# Patient Record
Sex: Male | Born: 1980
Health system: Southern US, Community
[De-identification: ages and names within clinical notes are randomized; demographics above are authoritative.]

## PROBLEM LIST (undated history)

## (undated) DIAGNOSIS — IMO0002 Reserved for concepts with insufficient information to code with codable children: Secondary | ICD-10-CM

## (undated) DIAGNOSIS — R0600 Dyspnea, unspecified: Secondary | ICD-10-CM

## (undated) DIAGNOSIS — K2970 Gastritis, unspecified, without bleeding: Secondary | ICD-10-CM

## (undated) DIAGNOSIS — K859 Acute pancreatitis without necrosis or infection, unspecified: Secondary | ICD-10-CM

## (undated) DIAGNOSIS — F329 Major depressive disorder, single episode, unspecified: Secondary | ICD-10-CM

## (undated) DIAGNOSIS — J45909 Unspecified asthma, uncomplicated: Secondary | ICD-10-CM

## (undated) DIAGNOSIS — F32A Depression, unspecified: Secondary | ICD-10-CM

## (undated) DIAGNOSIS — K219 Gastro-esophageal reflux disease without esophagitis: Secondary | ICD-10-CM

## (undated) DIAGNOSIS — F419 Anxiety disorder, unspecified: Secondary | ICD-10-CM

## (undated) DIAGNOSIS — A048 Other specified bacterial intestinal infections: Secondary | ICD-10-CM

## (undated) DIAGNOSIS — F1010 Alcohol abuse, uncomplicated: Secondary | ICD-10-CM

## (undated) DIAGNOSIS — F319 Bipolar disorder, unspecified: Secondary | ICD-10-CM

## (undated) DIAGNOSIS — M1990 Unspecified osteoarthritis, unspecified site: Secondary | ICD-10-CM

## (undated) HISTORY — DX: Depression, unspecified: F32.A

## (undated) HISTORY — DX: Gastritis, unspecified, without bleeding: K29.70

## (undated) HISTORY — DX: Anxiety disorder, unspecified: F41.9

## (undated) HISTORY — PX: FINGER SURGERY: SHX640

## (undated) HISTORY — DX: Major depressive disorder, single episode, unspecified: F32.9

## (undated) HISTORY — DX: Other specified bacterial intestinal infections: A04.8

## (undated) HISTORY — DX: Unspecified asthma, uncomplicated: J45.909

## (undated) HISTORY — DX: Alcohol abuse, uncomplicated: F10.10

## (undated) HISTORY — PX: OTHER SURGICAL HISTORY: SHX169

## (undated) HISTORY — DX: Unspecified osteoarthritis, unspecified site: M19.90

---

## 2011-05-08 ENCOUNTER — Emergency Department (HOSPITAL_COMMUNITY)
Admission: EM | Admit: 2011-05-08 | Discharge: 2011-05-08 | Disposition: A | Discharge status: Home / Self Care | Payer: Medicaid Other | Attending: Emergency Medicine | Admitting: Emergency Medicine

## 2011-05-08 DIAGNOSIS — R1110 Vomiting, unspecified: Secondary | ICD-10-CM | POA: Insufficient documentation

## 2011-05-08 DIAGNOSIS — K859 Acute pancreatitis without necrosis or infection, unspecified: Secondary | ICD-10-CM | POA: Insufficient documentation

## 2011-05-08 DIAGNOSIS — R109 Unspecified abdominal pain: Secondary | ICD-10-CM | POA: Insufficient documentation

## 2011-05-08 HISTORY — DX: Reserved for concepts with insufficient information to code with codable children: IMO0002

## 2011-05-08 LAB — CBC
HCT: 46.8 % (ref 39.0–52.0)
Hemoglobin: 16.7 g/dL (ref 13.0–17.0)
MCH: 31.3 pg (ref 26.0–34.0)
MCHC: 35.7 g/dL (ref 30.0–36.0)
MCV: 87.8 fL (ref 78.0–100.0)
Platelets: 291 10*3/uL (ref 150–400)
RBC: 5.33 MIL/uL (ref 4.22–5.81)
RDW: 12.9 % (ref 11.5–15.5)
WBC: 14.1 10*3/uL — ABNORMAL HIGH (ref 4.0–10.5)

## 2011-05-08 LAB — COMPREHENSIVE METABOLIC PANEL
ALT: 15 U/L (ref 0–53)
AST: 22 U/L (ref 0–37)
Albumin: 4.6 g/dL (ref 3.5–5.2)
Alkaline Phosphatase: 103 U/L (ref 39–117)
BUN: 16 mg/dL (ref 6–23)
CO2: 25 mEq/L (ref 19–32)
Calcium: 9.9 mg/dL (ref 8.4–10.5)
Chloride: 100 mEq/L (ref 96–112)
Creatinine, Ser: 0.72 mg/dL (ref 0.50–1.35)
GFR calc Af Amer: >90 mL/min (ref >90)
GFR calc non Af Amer: >90 mL/min (ref >90)
Glucose, Bld: 91 mg/dL (ref 70–99)
Potassium: 3.2 mEq/L — ABNORMAL LOW (ref 3.5–5.1)
Sodium: 140 mEq/L (ref 135–145)
Total Bilirubin: 1.2 mg/dL (ref 0.3–1.2)
Total Protein: 8.2 g/dL (ref 6.0–8.3)

## 2011-05-08 LAB — DIFFERENTIAL
Basophils Absolute: 0 10*3/uL (ref 0.0–0.1)
Basophils Relative: 0 % (ref 0–1)
Eosinophils Absolute: 0.1 10*3/uL (ref 0.0–0.7)
Eosinophils Relative: 0 % (ref 0–5)
Lymphocytes Relative: 23 % (ref 12–46)
Lymphs Abs: 3.2 10*3/uL (ref 0.7–4.0)
Monocytes Absolute: 1.2 10*3/uL — ABNORMAL HIGH (ref 0.1–1.0)
Monocytes Relative: 9 % (ref 3–12)
Neutro Abs: 9.6 10*3/uL — ABNORMAL HIGH (ref 1.7–7.7)
Neutrophils Relative %: 68 % (ref 43–77)

## 2011-05-08 LAB — LIPASE, BLOOD: Lipase: 181 U/L — ABNORMAL HIGH (ref 11–59)

## 2011-05-08 MED ORDER — ONDANSETRON HCL 4 MG/2ML IJ SOLN
4.0000 mg | Freq: Once | INTRAMUSCULAR | Status: AC
  Administered 2011-05-08: 4 mg via INTRAVENOUS

## 2011-05-08 MED ORDER — ONDANSETRON HCL 4 MG/2ML IJ SOLN
INTRAMUSCULAR | Status: AC
  Administered 2011-05-08: 4 mg via INTRAVENOUS
  Filled 2011-05-08: qty 2

## 2011-05-08 MED ORDER — OXYCODONE-ACETAMINOPHEN 5-325 MG PO TABS: 2.0000 | ORAL_TABLET | ORAL | Status: DC | PRN

## 2011-05-08 MED ORDER — SODIUM CHLORIDE 0.9 % IV BOLUS (SEPSIS)
1000.0000 mL | Freq: Once | INTRAVENOUS | Status: AC
  Administered 2011-05-08 (×2): 1000 mL via INTRAVENOUS

## 2011-05-08 MED ORDER — PROMETHAZINE HCL 25 MG PO TABS: 25.0000 mg | ORAL_TABLET | Freq: Four times a day (QID) | ORAL | Status: DC | PRN

## 2011-05-08 MED ORDER — ONDANSETRON HCL 4 MG/2ML IJ SOLN
INTRAMUSCULAR | Status: AC
  Filled 2011-05-08: qty 2

## 2011-05-08 MED ORDER — FENTANYL CITRATE 0.05 MG/ML IJ SOLN
50.0000 ug | Freq: Once | INTRAMUSCULAR | Status: AC
  Administered 2011-05-08: 50 ug via INTRAVENOUS
  Filled 2011-05-08: qty 2

## 2011-05-08 NOTE — ED Provider Notes (Signed)
History     CSN: [id number] Arrival date & time: 05/08/2011  9:45 AM   First MD Initiated Contact with Patient 05/08/11 1120      Chief Complaint  Patient presents with  . Emesis    (Consider location/radiation/quality/duration/timing/severity/associated sxs/prior treatment) Patient is a 30 y.o. male presenting with vomiting. The history is provided by the patient.  Emesis  This is a new problem. Associated symptoms include abdominal pain. Pertinent negatives include no diarrhea and no headaches.   patient has had upper abdominal pain and vomiting Friday. He states his been unable to keep anything down. He states that when he eats the pain gets worse. He states she's taken Zofran and Phenergan without relief. He drinks alcohol. He has not vomited blood. No diarrhea. No fevers. He has not had pain like this previously. He has not had sick contacts. He does have a history of ulcers.  Past Medical History  Diagnosis Date  . Ulcer     Past Surgical History  Procedure Date  . Hand surgery     History reviewed. No pertinent family history.  History  Substance Use Topics  . Smoking status: Current Everyday Smoker  . Smokeless tobacco: Not on file  . Alcohol Use: Yes      Review of Systems  Constitutional: Negative for activity change and appetite change.  HENT: Negative for neck stiffness.   Eyes: Negative for pain.  Respiratory: Negative for chest tightness and shortness of breath.   Cardiovascular: Negative for chest pain and leg swelling.  Gastrointestinal: Positive for nausea, vomiting and abdominal pain. Negative for diarrhea, blood in stool and anal bleeding.  Genitourinary: Negative for flank pain.  Musculoskeletal: Negative for back pain.  Skin: Negative for rash.  Neurological: Negative for weakness, numbness and headaches.  Psychiatric/Behavioral: Negative for behavioral problems.    Allergies  Review of patient's allergies indicates no known  allergies.  Home Medications   Current Outpatient Rx  Name Route Sig Dispense Refill  . LANSOPRAZOLE 30 MG PO CPDR Oral Take 30 mg by mouth daily.      . OXYCODONE-ACETAMINOPHEN 5-325 MG PO TABS Oral Take 2 tablets by mouth every 4 (four) hours as needed for pain. 20 tablet 0  . PROMETHAZINE HCL 25 MG PO TABS Oral Take 1 tablet (25 mg total) by mouth every 6 (six) hours as needed for nausea. 30 tablet 0    BP 136/90  Pulse 72  Temp(Src) 98.4 F (36.9 C) (Oral)  Resp 19  SpO2 97%  Physical Exam  Nursing note and vitals reviewed. Constitutional: He is oriented to person, place, and time. He appears well-developed and well-nourished.  HENT:  Head: Normocephalic and atraumatic.  Eyes: EOM are normal. Pupils are equal, round, and reactive to light.  Neck: Normal range of motion. Neck supple.  Cardiovascular: Normal rate, regular rhythm and normal heart sounds.   No murmur heard. Pulmonary/Chest: Effort normal and breath sounds normal.  Abdominal: Soft. Bowel sounds are normal. He exhibits no distension and no mass. There is tenderness. There is no rebound and no guarding.       Tender left upper abdomen and epigastric area. No right upper quadrant tenderness. No rebound or guarding.  Musculoskeletal: Normal range of motion. He exhibits no edema.  Neurological: He is alert and oriented to person, place, and time. No cranial nerve deficit.  Skin: Skin is warm and dry.  Psychiatric: He has a normal mood and affect.    ED Course  Procedures (including  critical care time)  Labs Reviewed  CBC - Abnormal; Notable for the following:    WBC 14.1 (*)    All other components within normal limits  DIFFERENTIAL - Abnormal; Notable for the following:    Neutro Abs 9.6 (*)    Monocytes Absolute 1.2 (*)    All other components within normal limits  COMPREHENSIVE METABOLIC PANEL - Abnormal; Notable for the following:    Potassium 3.2 (*)    All other components within normal limits   LIPASE, BLOOD - Abnormal; Notable for the following:    Lipase 181 (*)    All other components within normal limits   No results found.   1. Pancreatitis       MDM  Patient has had abdominal pain and NV for the last 5 days. Labs reassuring except for pancreatitis. Tolerating orals here. Requesting discharge home. Will d/c with pain meds and antiemetics.         Juliet Rude. Rubin Payor, MD 05/08/11 [phone]

## 2011-05-08 NOTE — ED Notes (Signed)
Lab results have returned.  Waiting for MD re-evaluation.

## 2011-05-08 NOTE — ED Notes (Signed)
Pt and family updated that we are waiting for lab results.  THe laboratory has be notified about delay in results.

## 2011-05-08 NOTE — ED Notes (Signed)
Pt c/o N/V since Friday; states that he is unable to keep anything down. Pt states that he is taking phenergan.

## 2011-05-08 NOTE — ED Notes (Signed)
Family at bedside. 

## 2011-05-08 NOTE — ED Notes (Signed)
The pt is cramping since he drank the coke

## 2011-05-08 NOTE — ED Notes (Signed)
Patient is resting comfortably. 

## 2011-05-08 NOTE — ED Notes (Signed)
PT states that he has been unable to keep any po's down since Friday.  Pt states that he has only had 1.5 cans of chicken noodle soup.  Pt has no vomiting at this time.

## 2011-05-10 ENCOUNTER — Emergency Department (HOSPITAL_COMMUNITY)
Admission: EM | Admit: 2011-05-10 | Discharge: 2011-05-11 | Disposition: A | Discharge status: Home / Self Care | Payer: Medicaid Other | Attending: Emergency Medicine | Admitting: Emergency Medicine

## 2011-05-10 ENCOUNTER — Emergency Department (HOSPITAL_COMMUNITY): Payer: Medicaid Other

## 2011-05-10 ENCOUNTER — Encounter (HOSPITAL_COMMUNITY): Payer: Self-pay | Admitting: *Deleted

## 2011-05-10 DIAGNOSIS — R10819 Abdominal tenderness, unspecified site: Secondary | ICD-10-CM | POA: Insufficient documentation

## 2011-05-10 DIAGNOSIS — F17200 Nicotine dependence, unspecified, uncomplicated: Secondary | ICD-10-CM | POA: Insufficient documentation

## 2011-05-10 DIAGNOSIS — Z8711 Personal history of peptic ulcer disease: Secondary | ICD-10-CM | POA: Insufficient documentation

## 2011-05-10 DIAGNOSIS — R109 Unspecified abdominal pain: Secondary | ICD-10-CM | POA: Insufficient documentation

## 2011-05-10 HISTORY — DX: Acute pancreatitis without necrosis or infection, unspecified: K85.90

## 2011-05-10 LAB — CBC
HCT: 37.3 % — ABNORMAL LOW (ref 39.0–52.0)
Hemoglobin: 13.3 g/dL (ref 13.0–17.0)
MCH: 30.8 pg (ref 26.0–34.0)
MCHC: 35.7 g/dL (ref 30.0–36.0)
MCV: 86.3 fL (ref 78.0–100.0)
Platelets: 228 10*3/uL (ref 150–400)
RBC: 4.32 MIL/uL (ref 4.22–5.81)
RDW: 12.5 % (ref 11.5–15.5)
WBC: 12.8 10*3/uL — ABNORMAL HIGH (ref 4.0–10.5)

## 2011-05-10 LAB — URINALYSIS, ROUTINE W REFLEX MICROSCOPIC
Bilirubin Urine: NEGATIVE
Glucose, UA: NEGATIVE mg/dL
Hgb urine dipstick: NEGATIVE
Ketones, ur: >80 mg/dL — AB
Leukocytes, UA: NEGATIVE
Nitrite: NEGATIVE
Protein, ur: NEGATIVE mg/dL
Specific Gravity, Urine: 1.025 (ref 1.005–1.030)
Urobilinogen, UA: 1 mg/dL (ref 0.0–1.0)
pH: 8.5 — ABNORMAL HIGH (ref 5.0–8.0)

## 2011-05-10 LAB — BASIC METABOLIC PANEL
BUN: 12 mg/dL (ref 6–23)
CO2: 21 mEq/L (ref 19–32)
Calcium: 8.8 mg/dL (ref 8.4–10.5)
Chloride: 104 mEq/L (ref 96–112)
Creatinine, Ser: 0.58 mg/dL (ref 0.50–1.35)
GFR calc Af Amer: >90 mL/min (ref >90)
GFR calc non Af Amer: >90 mL/min (ref >90)
Glucose, Bld: 107 mg/dL — ABNORMAL HIGH (ref 70–99)
Potassium: 3.3 mEq/L — ABNORMAL LOW (ref 3.5–5.1)
Sodium: 138 mEq/L (ref 135–145)

## 2011-05-10 LAB — URINE MICROSCOPIC-ADD ON

## 2011-05-10 LAB — LIPASE, BLOOD: Lipase: 17 U/L (ref 11–59)

## 2011-05-10 MED ORDER — HYDROMORPHONE HCL PF 1 MG/ML IJ SOLN
1.0000 mg | Freq: Once | INTRAMUSCULAR | Status: AC
  Administered 2011-05-10: 1 mg via INTRAVENOUS
  Filled 2011-05-10: qty 1

## 2011-05-10 MED ORDER — IOHEXOL 300 MG/ML  SOLN
100.0000 mL | Freq: Once | INTRAMUSCULAR | Status: AC | PRN
  Administered 2011-05-10: 100 mL via INTRAVENOUS

## 2011-05-10 MED ORDER — ONDANSETRON HCL 4 MG/2ML IJ SOLN
4.0000 mg | Freq: Once | INTRAMUSCULAR | Status: AC
  Administered 2011-05-10: 20:00:00 via INTRAVENOUS
  Filled 2011-05-10: qty 2

## 2011-05-10 MED ORDER — ONDANSETRON HCL 4 MG/2ML IJ SOLN
INTRAMUSCULAR | Status: AC
  Filled 2011-05-10: qty 2

## 2011-05-10 NOTE — ED Provider Notes (Signed)
History     CSN: [id number] Arrival date & time: 05/10/2011  7:24 PM   First MD Initiated Contact with Patient 05/10/11 2106      Chief Complaint  Patient presents with  . Abdominal Pain    (Consider location/radiation/quality/duration/timing/severity/associated sxs/prior treatment) HPI The patient presents today as after a recent presentation for abdominal pain to [facility]. He notes that over the interval 2 days he's had increasing pain, focally about the epigastrium. The pain is sharp, crampy, nonradiating. The pain was minimally relieved with oral narcotics, but now there is no alleviating factors. The pain is worsening with no clear provoking factor. He was diagnosed 2 days ago with pancreatitis. Since discharge he has become in tolerance of food, and oral medications, including Percocet and Zofran. He now notes that with his increasing pain and nausea, he is feeling more fatigued as well. No fever, no chills, no diarrhea, no urinary complaints. The patient previously. Although he has not had anything to drink in several days. Past Medical History  Diagnosis Date  . Ulcer   . Pancreatitis     Past Surgical History  Procedure Date  . Hand surgery     No family history on file.  History  Substance Use Topics  . Smoking status: Current Everyday Smoker  . Smokeless tobacco: Not on file  . Alcohol Use: Yes      Review of Systems Gen: Per HPI HEENT: No HA CV: No CP Resp: No dyspnea Abd: Per HPI, otherwise negative Musk: Per HPI, otherwise negative Neuro: No dysesthesia, or focal changes GU: Per HPI, otherwise negative Skin: Neg Psych: Neg  Allergies  Review of patient's allergies indicates no known allergies.  Home Medications   Current Outpatient Rx  Name Route Sig Dispense Refill  . ALPRAZOLAM 1 MG PO TABS Oral Take 1 mg by mouth daily.      Marland Kitchen LANSOPRAZOLE 30 MG PO CPDR Oral Take 30 mg by mouth daily.      . OXYCODONE-ACETAMINOPHEN 5-325 MG PO  TABS Oral Take 2 tablets by mouth every 4 (four) hours as needed for pain. 20 tablet 0  . PROMETHAZINE HCL 25 MG PO TABS Oral Take 1 tablet (25 mg total) by mouth every 6 (six) hours as needed for nausea. 30 tablet 0    BP 185/82  Pulse 71  Temp(Src) 98.5 F (36.9 C) (Oral)  Resp 16  SpO2 100%  Physical Exam  Constitutional: He is oriented to person, place, and time.       Young male, visibly uncomfortable.  HENT:  Head: Normocephalic and atraumatic.  Eyes: Conjunctivae are normal. Pupils are equal, round, and reactive to light.  Neck: Neck supple.  Cardiovascular: Normal rate and regular rhythm.   Pulmonary/Chest: Effort normal and breath sounds normal.  Abdominal: There is tenderness. There is guarding.  Musculoskeletal: He exhibits no tenderness.  Neurological: He is alert and oriented to person, place, and time.  Skin: Skin is warm and dry.  Psychiatric: He has a normal mood and affect.    ED Course  Procedures (including critical care time)  Labs Reviewed  URINALYSIS, ROUTINE W REFLEX MICROSCOPIC - Abnormal; Notable for the following:    Appearance TURBID (*)    pH 8.5 (*)    Ketones, ur >80 (*)    All other components within normal limits  BASIC METABOLIC PANEL - Abnormal; Notable for the following:    Potassium 3.3 (*)    Glucose, Bld 107 (*)  All other components within normal limits  CBC - Abnormal; Notable for the following:    WBC 12.8 (*)    HCT 37.3 (*)    All other components within normal limits  URINE MICROSCOPIC-ADD ON - Abnormal; Notable for the following:    Bacteria, UA MANY (*)    All other components within normal limits  LIPASE, BLOOD   No results found.   No diagnosis found.  Young male with recent diagnosis of pancreatitis now presenting with worsening pain, by mouth intolerance. Concern for pancreatic pseudocyst. The patient will have CT, as he receives analgesia, and IV fluids.  MDM   This 30 year old male with recent diagnosis of  pancreatitis now presents with abdominal pain. On exam the patient is uncomfortable. The patient's labs are notable for a lipase of only 17, notably down. His urinalysis is suggestive of dehydration. The patient's CAT scan does not demonstrate acute findings. Given the patient's history of pancreatitis, recent alcohol use, stopping several days ago, as well as his presentation for abdominal pain and lack of by mouth tolerance, resolving pancreatitis is the most likely cause of this evenings presentation. Other considerations include acute food reaction, given the patient attempted to eat a cheeseburger today. Other acute GI etiology as are affectively ruled out via CT scan. On reexam the patient felt much better, was talking clearly, in no distress. He will receive an initial liter of normal saline, additional analgesia, and will be discharged with antiemetics.       Gerhard Munch, MD 05/11/11 [phone]

## 2011-05-10 NOTE — ED Notes (Signed)
[id number]<AV> Expected date:05/10/11<BR> Expected time: 7:13 PM<BR> Means of arrival:Ambulance<BR> Comments:<BR> EMS 31 GC, pancreatitis

## 2011-05-10 NOTE — ED Notes (Signed)
Pt was diagnosed with pancreatitis on Monday at [facility]. Pt had flare up around 4:30 today, n/v and pain 10/10. Given zofran enroute. Unable to tolerate percocet and phenergan d/t vomiting.

## 2011-05-11 MED ORDER — ONDANSETRON 4 MG PO TBDP: 8.0000 mg | ORAL_TABLET | Freq: Three times a day (TID) | ORAL | Status: AC | PRN

## 2011-05-11 MED ORDER — OXYCODONE-ACETAMINOPHEN 5-325 MG PO TABS: 1.0000 | ORAL_TABLET | ORAL | Status: AC | PRN

## 2011-05-11 MED ORDER — HYDROMORPHONE HCL PF 1 MG/ML IJ SOLN
1.0000 mg | Freq: Once | INTRAMUSCULAR | Status: AC
  Administered 2011-05-11: 1 mg via INTRAVENOUS
  Filled 2011-05-11: qty 1

## 2011-05-11 MED ORDER — SODIUM CHLORIDE 0.9 % IV BOLUS (SEPSIS)
1000.0000 mL | Freq: Once | INTRAVENOUS | Status: AC
  Administered 2011-05-11: 1000 mL via INTRAVENOUS

## 2011-05-11 MED ORDER — ONDANSETRON HCL 4 MG/2ML IJ SOLN
4.0000 mg | Freq: Once | INTRAMUSCULAR | Status: AC
  Administered 2011-05-11: 4 mg via INTRAVENOUS
  Filled 2011-05-11: qty 2

## 2011-12-30 ENCOUNTER — Emergency Department (HOSPITAL_COMMUNITY)
Admission: EM | Admit: 2011-12-30 | Discharge: 2011-12-30 | Disposition: A | Discharge status: Home / Self Care | Payer: BC Managed Care – PPO | Attending: Emergency Medicine | Admitting: Emergency Medicine

## 2011-12-30 ENCOUNTER — Encounter (HOSPITAL_COMMUNITY): Payer: Self-pay | Admitting: *Deleted

## 2011-12-30 ENCOUNTER — Emergency Department (HOSPITAL_COMMUNITY): Payer: BC Managed Care – PPO

## 2011-12-30 DIAGNOSIS — R109 Unspecified abdominal pain: Secondary | ICD-10-CM | POA: Insufficient documentation

## 2011-12-30 DIAGNOSIS — K2970 Gastritis, unspecified, without bleeding: Secondary | ICD-10-CM | POA: Insufficient documentation

## 2011-12-30 DIAGNOSIS — R112 Nausea with vomiting, unspecified: Secondary | ICD-10-CM | POA: Insufficient documentation

## 2011-12-30 DIAGNOSIS — R630 Anorexia: Secondary | ICD-10-CM | POA: Insufficient documentation

## 2011-12-30 DIAGNOSIS — K2990 Gastroduodenitis, unspecified, without bleeding: Secondary | ICD-10-CM | POA: Insufficient documentation

## 2011-12-30 DIAGNOSIS — R6883 Chills (without fever): Secondary | ICD-10-CM | POA: Insufficient documentation

## 2011-12-30 LAB — COMPREHENSIVE METABOLIC PANEL
ALT: 33 U/L (ref 0–53)
AST: 42 U/L — ABNORMAL HIGH (ref 0–37)
Albumin: 4.4 g/dL (ref 3.5–5.2)
Alkaline Phosphatase: 80 U/L (ref 39–117)
BUN: 15 mg/dL (ref 6–23)
CO2: 25 mEq/L (ref 19–32)
Calcium: 9.3 mg/dL (ref 8.4–10.5)
Chloride: 104 mEq/L (ref 96–112)
Creatinine, Ser: 0.63 mg/dL (ref 0.50–1.35)
GFR calc Af Amer: >90 mL/min (ref >90)
GFR calc non Af Amer: >90 mL/min (ref >90)
Glucose, Bld: 126 mg/dL — ABNORMAL HIGH (ref 70–99)
Potassium: 3.7 mEq/L (ref 3.5–5.1)
Sodium: 140 mEq/L (ref 135–145)
Total Bilirubin: 0.8 mg/dL (ref 0.3–1.2)
Total Protein: 7.5 g/dL (ref 6.0–8.3)

## 2011-12-30 LAB — URINALYSIS, ROUTINE W REFLEX MICROSCOPIC
Bilirubin Urine: NEGATIVE
Glucose, UA: NEGATIVE mg/dL
Hgb urine dipstick: NEGATIVE
Ketones, ur: NEGATIVE mg/dL
Leukocytes, UA: NEGATIVE
Nitrite: NEGATIVE
Protein, ur: NEGATIVE mg/dL
Specific Gravity, Urine: 1.022 (ref 1.005–1.030)
Urobilinogen, UA: 0.2 mg/dL (ref 0.0–1.0)
pH: 6.5 (ref 5.0–8.0)

## 2011-12-30 LAB — CBC
HCT: 41.2 % (ref 39.0–52.0)
Hemoglobin: 14.7 g/dL (ref 13.0–17.0)
MCH: 31.5 pg (ref 26.0–34.0)
MCHC: 35.7 g/dL (ref 30.0–36.0)
MCV: 88.2 fL (ref 78.0–100.0)
Platelets: 244 10*3/uL (ref 150–400)
RBC: 4.67 MIL/uL (ref 4.22–5.81)
RDW: 13.3 % (ref 11.5–15.5)
WBC: 15.4 10*3/uL — ABNORMAL HIGH (ref 4.0–10.5)

## 2011-12-30 LAB — LIPASE, BLOOD: Lipase: 19 U/L (ref 11–59)

## 2011-12-30 LAB — LACTIC ACID, PLASMA: Lactic Acid, Venous: 1 mmol/L (ref 0.5–2.2)

## 2011-12-30 MED ORDER — FAMOTIDINE IN NACL 20-0.9 MG/50ML-% IV SOLN
20.0000 mg | Freq: Once | INTRAVENOUS | Status: AC
  Administered 2011-12-30: 20 mg via INTRAVENOUS
  Filled 2011-12-30: qty 50

## 2011-12-30 MED ORDER — HYDROMORPHONE HCL PF 1 MG/ML IJ SOLN
1.0000 mg | Freq: Once | INTRAMUSCULAR | Status: AC
  Administered 2011-12-30: 1 mg via INTRAVENOUS
  Filled 2011-12-30 (×2): qty 1

## 2011-12-30 MED ORDER — ONDANSETRON HCL 4 MG/2ML IJ SOLN
4.0000 mg | Freq: Once | INTRAMUSCULAR | Status: AC
  Administered 2011-12-30: 4 mg via INTRAVENOUS

## 2011-12-30 MED ORDER — ONDANSETRON HCL 4 MG/2ML IJ SOLN
4.0000 mg | Freq: Once | INTRAMUSCULAR | Status: AC
  Administered 2011-12-30: 4 mg via INTRAVENOUS
  Filled 2011-12-30 (×2): qty 2

## 2011-12-30 MED ORDER — OXYCODONE-ACETAMINOPHEN 5-325 MG PO TABS: 1.0000 | ORAL_TABLET | Freq: Four times a day (QID) | ORAL | Status: DC | PRN

## 2011-12-30 MED ORDER — SUCRALFATE 1 GM/10ML PO SUSP: 1.0000 g | Freq: Four times a day (QID) | ORAL | Status: DC

## 2011-12-30 MED ORDER — RANITIDINE HCL 300 MG PO TABS: 300.0000 mg | ORAL_TABLET | Freq: Two times a day (BID) | ORAL | Status: DC

## 2011-12-30 MED ORDER — ONDANSETRON HCL 4 MG/2ML IJ SOLN
INTRAMUSCULAR | Status: AC
  Filled 2011-12-30: qty 2

## 2011-12-30 MED ORDER — HYDROMORPHONE HCL PF 1 MG/ML IJ SOLN
1.0000 mg | Freq: Once | INTRAMUSCULAR | Status: AC
  Administered 2011-12-30: 1 mg via INTRAVENOUS
  Filled 2011-12-30: qty 1

## 2011-12-30 NOTE — ED Notes (Signed)
Reports having abd pain and n/v. Went to [facility] on thurs for same, has hx of pancreatitis. Denies diarrhea.

## 2011-12-30 NOTE — ED Provider Notes (Signed)
History     CSN: [id number]  Arrival date & time 12/30/11  1242   First MD Initiated Contact with Patient 12/30/11 1504      Chief Complaint  Patient presents with  . Abdominal Pain  . Emesis    (Consider location/radiation/quality/duration/timing/severity/associated sxs/prior treatment) Patient is a 31 y.o. male presenting with abdominal pain and vomiting. The history is provided by the patient.  Abdominal Pain The primary symptoms of the illness include abdominal pain, nausea and vomiting. The primary symptoms of the illness do not include diarrhea or dysuria. The current episode started 2 days ago. The onset of the illness was gradual. The problem has been gradually worsening.  The abdominal pain is located in the epigastric region. The abdominal pain radiates to the LUQ, RLQ and periumbilical region. The severity of the abdominal pain is 8/10. The abdominal pain is relieved by nothing. The abdominal pain is exacerbated by eating and vomiting.  The vomiting began 2 days ago. The emesis contains stomach contents.  Associated with: no recent NSAID or alcohol use. The patient has not had a change in bowel habit. Additional symptoms associated with the illness include chills and anorexia. Symptoms associated with the illness do not include diaphoresis, urgency, frequency or back pain. Significant associated medical issues do not include gallstones or liver disease. Associated medical issues comments: hx of pancreatitis.  Emesis  Associated symptoms include abdominal pain and chills. Pertinent negatives include no diarrhea.    Past Medical History  Diagnosis Date  . Ulcer   . Pancreatitis     Past Surgical History  Procedure Date  . Hand surgery     History reviewed. No pertinent family history.  History  Substance Use Topics  . Smoking status: Current Everyday Smoker  . Smokeless tobacco: Not on file  . Alcohol Use: Yes      Review of Systems  Constitutional: Positive  for chills. Negative for diaphoresis.  Gastrointestinal: Positive for nausea, vomiting, abdominal pain and anorexia. Negative for diarrhea.  Genitourinary: Negative for dysuria, urgency and frequency.  Musculoskeletal: Negative for back pain.  All other systems reviewed and are negative.    Allergies  Review of patient's allergies indicates no known allergies.  Home Medications   Current Outpatient Rx  Name Route Sig Dispense Refill  . ALPRAZOLAM 1 MG PO TABS Oral Take 1 mg by mouth daily.      Marland Kitchen LANSOPRAZOLE 30 MG PO CPDR Oral Take 30 mg by mouth daily.        BP 145/95  Pulse 50  Temp 98 F (36.7 C) (Oral)  Resp 18  SpO2 99%  Physical Exam  Nursing note and vitals reviewed. Constitutional: He is oriented to person, place, and time. He appears well-developed and well-nourished. He appears distressed.  HENT:  Head: Normocephalic and atraumatic.  Mouth/Throat: Oropharynx is clear and moist.  Eyes: Conjunctivae and EOM are normal. Pupils are equal, round, and reactive to light.  Neck: Normal range of motion. Neck supple.  Cardiovascular: Normal rate, regular rhythm and intact distal pulses.   No murmur heard. Pulmonary/Chest: Effort normal and breath sounds normal. No respiratory distress. He has no wheezes. He has no rales.  Abdominal: Soft. Normal appearance and bowel sounds are normal. He exhibits no distension. There is tenderness in the epigastric area and periumbilical area. There is guarding. There is no rebound and no CVA tenderness.  Musculoskeletal: Normal range of motion. He exhibits no edema and no tenderness.  Neurological: He is alert and  oriented to person, place, and time.  Skin: Skin is warm and dry. No rash noted. No erythema.  Psychiatric: He has a normal mood and affect. His behavior is normal.    ED Course  Procedures (including critical care time)  Labs Reviewed  CBC - Abnormal; Notable for the following:    WBC 15.4 (*)     All other components  within normal limits  COMPREHENSIVE METABOLIC PANEL - Abnormal; Notable for the following:    Glucose, Bld 126 (*)     AST 42 (*)  HEMOLYSIS AT THIS LEVEL MAY AFFECT RESULT   All other components within normal limits  URINALYSIS, ROUTINE W REFLEX MICROSCOPIC  LIPASE, BLOOD  LACTIC ACID, PLASMA   US Abdomen Complete  12/30/2011  *RADIOLOGY REPORT*  Clinical Data:  Abdominal pain and emesis  ABDOMINAL ULTRASOUND COMPLETE  Comparison:  None.  Findings:  Gallbladder:  No gallstones, gallbladder wall thickening, or pericholecystic fluid.  Common Bile Duct:  Within normal limits in caliber.  Liver: No focal mass lesion identified.  Within normal limits in parenchymal echogenicity.  IVC:  Appears normal.  Pancreas:  No abnormality identified.  Spleen:  Within normal limits in size and echotexture.  Right kidney:  Normal in size and parenchymal echogenicity.  No evidence of mass or hydronephrosis.  Left kidney:  Normal in size and parenchymal echogenicity.  No evidence of mass or hydronephrosis.  Abdominal Aorta:  No aneurysm identified.  IMPRESSION: Within normal limits.  Original Report Authenticated By: Donavan Burnet, M.D.     No diagnosis found.    MDM   Patient with upper abdominal pain that started 3 days ago. He was initially seen his PCP office and had a negative CT scan on Friday. Since that time he's continued to vomit with abdominal pain but no diarrhea. Patient states that he's taking Zofran, Phenergan, Prevacid without improvement. He has a history of pancreatitis but states he's not drinking alcohol for several weeks prior to the pain starting.  CBC with leukocytosis of 15,000 but normal hemoglobin. CMP with an elevated AST but otherwise normal.  Lipase, UA and lactate pending. Patient started on IV fluids, pain, nausea and antacid medication.  6:22 PM Labs are within normal limits. Ultrasound is unremarkable. On reevaluation patient stomach pain is improving. Patient was given one  more dose of pain medication and by mouth challenge. Discussed with him starting Zantac in addition to the Prilosec. Also given referral for GI.      Gwyneth Sprout, MD 12/30/11 [phone]

## 2011-12-30 NOTE — ED Notes (Signed)
Patient is alert and oriented x4.  Patient's pain level is a 2.  Patient was explained discharge instructions and verbalized understanding. Patient's wife was present as well and did not have questions.  Patient was taken home by wife.  Vitals are recorded, IV discontinued.  No further actions taken.

## 2011-12-31 ENCOUNTER — Encounter: Payer: Self-pay | Admitting: Gastroenterology

## 2012-01-09 ENCOUNTER — Encounter: Payer: Self-pay | Admitting: Gastroenterology

## 2012-01-09 ENCOUNTER — Ambulatory Visit (INDEPENDENT_AMBULATORY_CARE_PROVIDER_SITE_OTHER): Payer: BC Managed Care – PPO | Admitting: Gastroenterology

## 2012-01-09 VITALS — BP 100/58 | HR 88 | Ht 68.0 in | Wt 163.1 lb

## 2012-01-09 DIAGNOSIS — R1013 Epigastric pain: Secondary | ICD-10-CM

## 2012-01-09 MED ORDER — ONDANSETRON HCL 4 MG PO TABS: 4.0000 mg | ORAL_TABLET | Freq: Once | ORAL | Status: DC

## 2012-01-09 MED ORDER — OXYCODONE-ACETAMINOPHEN 5-325 MG PO TABS: 1.0000 | ORAL_TABLET | Freq: Four times a day (QID) | ORAL | Status: AC | PRN

## 2012-01-09 NOTE — Assessment & Plan Note (Addendum)
Intermittent episodes of upper bowel pain with nausea and vomiting may be related to acute intermittent pancreatitis. It is noteworthy that lipase was elevated on only one occasion. Despite this I have some concern that pain is pancreatic in origin. Ulcer and nonulcer dyspepsia, possible intermittent gastric outlet obstruction are other concerns.  Recommendations #1 upper endoscopy #2 the patient was carefully counseled to not take NSAIDs or alcohol

## 2012-01-09 NOTE — Progress Notes (Signed)
History of Present Illness:  31 year old white male referred at the request of Dr. Daphine Deutscher for evaluation of abdominal pain. At least 4 times in the past year he's had episodes of severe midepigastric and upper epigastric pain accompanied by nausea and vomiting. Pain radiates through to the back. Episodes would last 3-4 days a time. In November lipase was elevated and he was diagnosed with pancreatitis. CT at that time was unremarkable. Most recent episode was last week where he developed his typical pain and protracted nausea and vomiting. He was seen in the ER where lipase level was normal.  Ultrasound was normal. He had minimal elevations of his transaminases. He has a history of alcohol abuse from several years ago. In the last year he has abstained from drinking.  He takes [place] powders very intermittently.  He has a history of peptic ulcer disease.    Past Medical History  Diagnosis Date  . Ulcer   . Pancreatitis   . Alcohol abuse   . Anxiety   . Arthritis   . Asthma   . Depression   . H. pylori infection   . Gastritis    Past Surgical History  Procedure Date  . Thumb surgery     right hand  . Finger surgery     left pinky   family history includes Diabetes in his maternal aunt and maternal grandfather; Heart disease in his paternal grandfather; Liver disease in his maternal aunt; and Stomach cancer in his maternal aunt. Current Outpatient Prescriptions  Medication Sig Dispense Refill  . ALPRAZolam (XANAX) 1 MG tablet Take 1 mg by mouth 2 (two) times daily.       . citalopram (CELEXA) 20 MG tablet Take 20 mg by mouth daily.      Marland Kitchen OLANZapine (ZYPREXA) 5 MG tablet Take 5 mg by mouth at bedtime.      Marland Kitchen omeprazole (PRILOSEC) 40 MG capsule Take 40 mg by mouth daily.      . ondansetron (ZOFRAN) 4 MG tablet Take 4 mg by mouth once. For nausea      . oxyCODONE-acetaminophen (PERCOCET) 5-325 MG per tablet Take 1-2 tablets by mouth every 6 (six) hours as needed for pain.  15 tablet  0  .  promethazine (PHENERGAN) 25 MG tablet Take 25 mg by mouth every 6 (six) hours as needed. For nausea/vomiting      . ranitidine (ZANTAC) 300 MG tablet Take 1 tablet (300 mg total) by mouth 2 (two) times daily.  60 tablet  0  . sucralfate (CARAFATE) 1 GM/10ML suspension Take 10 mLs (1 g total) by mouth 4 (four) times daily.  420 mL  0  . DISCONTD: promethazine (PHENERGAN) 25 MG tablet Take 1 tablet (25 mg total) by mouth every 6 (six) hours as needed for nausea.  30 tablet  0   Allergies as of 01/09/2012  . (No Known Allergies)    reports that he has been smoking Cigarettes.  He has never used smokeless tobacco. He reports that he drinks alcohol. He reports that he does not use illicit drugs.     Review of Systems: Pertinent positive and negative review of systems were noted in the above HPI section. All other review of systems were otherwise negative.  Vital signs were reviewed in today's medical record Physical Exam: General: Well developed , well nourished mildly uncomfortable because of pain.  He is heavily tattooed Head: Normocephalic and atraumatic Eyes:  sclerae anicteric, EOMI Ears: Normal auditory acuity Mouth: No deformity or  lesions Neck: Supple, no masses or thyromegaly Lungs: Clear throughout to auscultation Heart: Regular rate and rhythm; no murmurs, rubs or bruits Abdomen: Soft,  and non distended. No masses, hepatosplenomegaly or hernias noted. Normal Bowel sounds. There is mild tenderness in the midepigastrium Rectal:deferred Musculoskeletal: Symmetrical with no gross deformities  Skin: No lesions on visible extremities Pulses:  Normal pulses noted Extremities: No clubbing, cyanosis, edema or deformities noted Neurological: Alert oriented x 4, grossly nonfocal Cervical Nodes:  No significant cervical adenopathy Inguinal Nodes: No significant inguinal adenopathy Psychological:  Alert and cooperative. Normal mood and affect

## 2012-01-09 NOTE — Patient Instructions (Addendum)
You have been given a separate informational sheet regarding your tobacco use, the importance of quitting and local resources to help you quit. Your Endoscopy has been scheduled at [facility]  Separate instructions have been given We have given you a printed prescription of Precocet today  Upper GI Endoscopy Upper GI endoscopy means using a flexible scope to look at the esophagus, stomach, and upper small bowel. This is done to make a diagnosis in people with heartburn, abdominal pain, or abnormal bleeding. Sometimes an endoscope is needed to remove foreign bodies or food that become stuck in the esophagus; it can also be used to take biopsy samples. For the best results, do not eat or drink for 8 hours before having your upper endoscopy.  To perform the endoscopy, you will probably be sedated and your throat will be numbed with a special spray. The endoscope is then slowly passed down your throat (this will not interfere with your breathing). An endoscopy exam takes 15 to 30 minutes to complete and there is no real pain. Patients rarely remember much about the procedure. The results of the test may take several days if a biopsy or other test is taken.  You may have a sore throat after an endoscopy exam. Serious complications are very rare. Stick to liquids and soft foods until your pain is better. Do not drive a car or operate any dangerous equipment for at least 24 hours after being sedated. SEEK IMMEDIATE MEDICAL CARE IF:   You have severe throat pain.   You have shortness of breath.   You have bleeding problems.   You have a fever.   You have difficulty recovering from your sedation.  Document Released: 07/19/2004 Document Revised: 05/31/2011 Document Reviewed: 06/13/2008  [facility] Patient Information 2012 [place], [place].

## 2012-01-15 ENCOUNTER — Encounter (HOSPITAL_COMMUNITY)
Admission: RE | Disposition: A | Discharge status: Home / Self Care | Payer: Self-pay | Source: Ambulatory Visit | Attending: Gastroenterology

## 2012-01-15 ENCOUNTER — Encounter (HOSPITAL_COMMUNITY): Payer: Self-pay | Admitting: Gastroenterology

## 2012-01-15 ENCOUNTER — Ambulatory Visit (HOSPITAL_COMMUNITY)
Admission: RE | Admit: 2012-01-15 | Discharge: 2012-01-15 | Disposition: A | Discharge status: Home / Self Care | Payer: BC Managed Care – PPO | Source: Ambulatory Visit | Attending: Gastroenterology | Admitting: Gastroenterology

## 2012-01-15 DIAGNOSIS — R109 Unspecified abdominal pain: Secondary | ICD-10-CM | POA: Insufficient documentation

## 2012-01-15 DIAGNOSIS — F17200 Nicotine dependence, unspecified, uncomplicated: Secondary | ICD-10-CM | POA: Insufficient documentation

## 2012-01-15 DIAGNOSIS — Z79899 Other long term (current) drug therapy: Secondary | ICD-10-CM | POA: Insufficient documentation

## 2012-01-15 DIAGNOSIS — R1013 Epigastric pain: Secondary | ICD-10-CM

## 2012-01-15 DIAGNOSIS — Z800 Family history of malignant neoplasm of digestive organs: Secondary | ICD-10-CM | POA: Insufficient documentation

## 2012-01-15 HISTORY — PX: ESOPHAGOGASTRODUODENOSCOPY: SHX5428

## 2012-01-15 SURGERY — EGD (ESOPHAGOGASTRODUODENOSCOPY)
Anesthesia: Moderate Sedation

## 2012-01-15 MED ORDER — MIDAZOLAM HCL 10 MG/2ML IJ SOLN
INTRAMUSCULAR | Status: AC
  Filled 2012-01-15: qty 2

## 2012-01-15 MED ORDER — SODIUM CHLORIDE 0.9 % IV SOLN
INTRAVENOUS | Status: DC
Start: 2012-01-15 — End: 2012-01-15

## 2012-01-15 MED ORDER — FENTANYL CITRATE 0.05 MG/ML IJ SOLN
INTRAMUSCULAR | Status: AC
  Filled 2012-01-15: qty 2

## 2012-01-15 MED ORDER — DIPHENHYDRAMINE HCL 50 MG/ML IJ SOLN
INTRAMUSCULAR | Status: DC | PRN
  Administered 2012-01-15 (×2): 25 mg via INTRAVENOUS

## 2012-01-15 MED ORDER — GLYCOPYRROLATE 0.2 MG/ML IJ SOLN
INTRAMUSCULAR | Status: AC
  Filled 2012-01-15: qty 1

## 2012-01-15 MED ORDER — BUTAMBEN-TETRACAINE-BENZOCAINE 2-2-14 % EX AERO
INHALATION_SPRAY | CUTANEOUS | Status: DC | PRN
  Administered 2012-01-15: 2 via TOPICAL

## 2012-01-15 MED ORDER — DIPHENHYDRAMINE HCL 50 MG/ML IJ SOLN
INTRAMUSCULAR | Status: AC
  Filled 2012-01-15: qty 1

## 2012-01-15 MED ORDER — FENTANYL CITRATE 0.05 MG/ML IJ SOLN
INTRAMUSCULAR | Status: DC | PRN
  Administered 2012-01-15 (×4): 25 ug via INTRAVENOUS

## 2012-01-15 MED ORDER — MIDAZOLAM HCL 10 MG/2ML IJ SOLN
INTRAMUSCULAR | Status: DC | PRN
  Administered 2012-01-15 (×5): 2 mg via INTRAVENOUS

## 2012-01-15 NOTE — Op Note (Signed)
[facility] [address] [place], [place]  [address]  ENDOSCOPY PROCEDURE REPORT  PATIENT:  Douglas Koch, Douglas Koch  MR#:  [id number] BIRTHDATE:  1981/05/12, 31 yrs. old  GENDER:  male  ENDOSCOPIST:  Barbette Hair. Arlyce Dice, MD Referred by:  PROCEDURE DATE:  01/15/2012 PROCEDURE:  EGD, diagnostic 43235 ASA CLASS:  Class II INDICATIONS:  abdominal pain  MEDICATIONS:   These medications were titrated to patient response per physician's verbal order, Fentanyl 100 mcg, Versed 10 mg IV, Benadryl 50 mg IV, glycopyrrolate (Robinal) 0.25 mg IV TOPICAL ANESTHETIC:  Cetacaine Spray  DESCRIPTION OF PROCEDURE:   After the risks and benefits of the procedure were explained, informed consent was obtained.  The endoscope was introduced through the mouth and advanced to the third portion of the duodenum.  The instrument was slowly withdrawn as the mucosa was fully examined. <<PROCEDUREIMAGES>>  The upper, middle, and distal third of the esophagus were carefully inspected and no abnormalities were noted. The z-line was well seen at the GEJ. The endoscope was pushed into the fundus which was normal including a retroflexed view. The antrum,gastric body, first and second part of the duodenum were unremarkable (see image1, image2, and image3).    Retroflexed views revealed no abnormalities.    The scope was then withdrawn from the patient and the procedure completed.  COMPLICATIONS:  None  ENDOSCOPIC IMPRESSION: 1) Normal EGD RECOMMENDATIONS:STAT evaluation during pain, nausea and vomiting  ______________________________ Barbette Hair. Arlyce Dice, MD  CC:  n. eSIGNED:   Barbette Hair. Shirely Toren at 01/15/2012 03:01 PM  Irven Easterly, [phone]

## 2012-01-15 NOTE — H&P (View-Only) (Signed)
History of Present Illness:  31-year-old white male referred at the request of Dr. Martin for evaluation of abdominal pain. At least 4 times in the past year he's had episodes of severe midepigastric and upper epigastric pain accompanied by nausea and vomiting. Pain radiates through to the back. Episodes would last 3-4 days a time. In November lipase was elevated and he was diagnosed with pancreatitis. CT at that time was unremarkable. Most recent episode was last week where he developed his typical pain and protracted nausea and vomiting. He was seen in the ER where lipase level was normal.  Ultrasound was normal. He had minimal elevations of his transaminases. He has a history of alcohol abuse from several years ago. In the last year he has abstained from drinking.  He takes Goody powders very intermittently.  He has a history of peptic ulcer disease.    Past Medical History  Diagnosis Date  . Ulcer   . Pancreatitis   . Alcohol abuse   . Anxiety   . Arthritis   . Asthma   . Depression   . H. pylori infection   . Gastritis    Past Surgical History  Procedure Date  . Thumb surgery     right hand  . Finger surgery     left pinky   family history includes Diabetes in his maternal aunt and maternal grandfather; Heart disease in his paternal grandfather; Liver disease in his maternal aunt; and Stomach cancer in his maternal aunt. Current Outpatient Prescriptions  Medication Sig Dispense Refill  . ALPRAZolam (XANAX) 1 MG tablet Take 1 mg by mouth 2 (two) times daily.       . citalopram (CELEXA) 20 MG tablet Take 20 mg by mouth daily.      . OLANZapine (ZYPREXA) 5 MG tablet Take 5 mg by mouth at bedtime.      . omeprazole (PRILOSEC) 40 MG capsule Take 40 mg by mouth daily.      . ondansetron (ZOFRAN) 4 MG tablet Take 4 mg by mouth once. For nausea      . oxyCODONE-acetaminophen (PERCOCET) 5-325 MG per tablet Take 1-2 tablets by mouth every 6 (six) hours as needed for pain.  15 tablet  0  .  promethazine (PHENERGAN) 25 MG tablet Take 25 mg by mouth every 6 (six) hours as needed. For nausea/vomiting      . ranitidine (ZANTAC) 300 MG tablet Take 1 tablet (300 mg total) by mouth 2 (two) times daily.  60 tablet  0  . sucralfate (CARAFATE) 1 GM/10ML suspension Take 10 mLs (1 g total) by mouth 4 (four) times daily.  420 mL  0  . DISCONTD: promethazine (PHENERGAN) 25 MG tablet Take 1 tablet (25 mg total) by mouth every 6 (six) hours as needed for nausea.  30 tablet  0   Allergies as of 01/09/2012  . (No Known Allergies)    reports that he has been smoking Cigarettes.  He has never used smokeless tobacco. He reports that he drinks alcohol. He reports that he does not use illicit drugs.     Review of Systems: Pertinent positive and negative review of systems were noted in the above HPI section. All other review of systems were otherwise negative.  Vital signs were reviewed in today's medical record Physical Exam: General: Well developed , well nourished mildly uncomfortable because of pain.  He is heavily tattooed Head: Normocephalic and atraumatic Eyes:  sclerae anicteric, EOMI Ears: Normal auditory acuity Mouth: No deformity or   lesions Neck: Supple, no masses or thyromegaly Lungs: Clear throughout to auscultation Heart: Regular rate and rhythm; no murmurs, rubs or bruits Abdomen: Soft,  and non distended. No masses, hepatosplenomegaly or hernias noted. Normal Bowel sounds. There is mild tenderness in the midepigastrium Rectal:deferred Musculoskeletal: Symmetrical with no gross deformities  Skin: No lesions on visible extremities Pulses:  Normal pulses noted Extremities: No clubbing, cyanosis, edema or deformities noted Neurological: Alert oriented x 4, grossly nonfocal Cervical Nodes:  No significant cervical adenopathy Inguinal Nodes: No significant inguinal adenopathy Psychological:  Alert and cooperative. Normal mood and affect       

## 2012-01-15 NOTE — Interval H&P Note (Signed)
History and Physical Interval Note:  01/15/2012 2:40 PM  Douglas Koch  has presented today for surgery, with the diagnosis of abdominal pain  The various methods of treatment have been discussed with the patient and family. After consideration of risks, benefits and other options for treatment, the patient has consented to  Procedure(s) (LRB): ESOPHAGOGASTRODUODENOSCOPY (EGD) (N/A) as a surgical intervention .  The patient's history has been reviewed, patient examined, no change in status, stable for surgery.  I have reviewed the patient's chart and labs.  Questions were answered to the patient's satisfaction.    The recent H&P (dated *01/09/12**) was reviewed, the patient was examined and there is no change in the patients condition since that H&P was completed.   [name]  01/15/2012, 2:40 PM    [name]

## 2012-01-16 ENCOUNTER — Encounter (HOSPITAL_COMMUNITY): Payer: Self-pay

## 2012-01-16 ENCOUNTER — Encounter (HOSPITAL_COMMUNITY): Payer: Self-pay | Admitting: Gastroenterology

## 2012-09-12 ENCOUNTER — Other Ambulatory Visit: Payer: Self-pay | Admitting: Nurse Practitioner

## 2012-09-12 ENCOUNTER — Other Ambulatory Visit: Payer: Self-pay | Admitting: *Deleted

## 2012-09-12 MED ORDER — ALPRAZOLAM 1 MG PO TABS: 1.0000 mg | ORAL_TABLET | Freq: Two times a day (BID) | ORAL | Status: DC

## 2012-09-16 ENCOUNTER — Telehealth: Payer: Self-pay | Admitting: Nurse Practitioner

## 2012-09-16 NOTE — Telephone Encounter (Signed)
Records are being faxed her

## 2012-09-16 NOTE — Telephone Encounter (Signed)
Patient's wife is requesting that we call over to [facility] and request a copy of the blood work that the patient had done this weekend.

## 2012-09-22 ENCOUNTER — Telehealth: Payer: Self-pay | Admitting: Nurse Practitioner

## 2012-09-22 NOTE — Telephone Encounter (Signed)
Called and got records chart back on your desk

## 2012-09-22 NOTE — Telephone Encounter (Signed)
ALL labs from [facility] are WNL

## 2012-09-22 NOTE — Telephone Encounter (Signed)
Didn't get all of labs. Please get all labs results from [facility] to review

## 2012-09-22 NOTE — Telephone Encounter (Signed)
Records came her last week will put chart on your desk

## 2012-09-23 ENCOUNTER — Telehealth: Payer: Self-pay | Admitting: Nurse Practitioner

## 2012-09-23 NOTE — Telephone Encounter (Signed)
Notified wife that we had received all labwork from hospital and all levels were WNL.

## 2012-09-23 NOTE — Telephone Encounter (Signed)
Discussed with wife. See other note

## 2012-09-30 ENCOUNTER — Encounter: Payer: Self-pay | Admitting: Nurse Practitioner

## 2012-10-20 ENCOUNTER — Other Ambulatory Visit: Payer: Self-pay | Admitting: Nurse Practitioner

## 2012-10-20 DIAGNOSIS — B999 Unspecified infectious disease: Secondary | ICD-10-CM

## 2012-10-20 DIAGNOSIS — A419 Sepsis, unspecified organism: Secondary | ICD-10-CM

## 2012-10-20 MED ORDER — METRONIDAZOLE 500 MG PO TABS: 500.0000 mg | ORAL_TABLET | Freq: Three times a day (TID) | ORAL | Status: DC

## 2012-10-20 MED ORDER — CIPROFLOXACIN HCL 500 MG PO TABS: 500.0000 mg | ORAL_TABLET | Freq: Two times a day (BID) | ORAL | Status: DC

## 2012-10-21 ENCOUNTER — Encounter (HOSPITAL_COMMUNITY): Payer: Self-pay | Admitting: *Deleted

## 2012-10-21 ENCOUNTER — Emergency Department (HOSPITAL_COMMUNITY)
Admission: EM | Admit: 2012-10-21 | Discharge: 2012-10-21 | Discharge status: Against Medical Advice | Payer: BC Managed Care – PPO | Attending: Neurology | Admitting: Neurology

## 2012-10-21 DIAGNOSIS — F17200 Nicotine dependence, unspecified, uncomplicated: Secondary | ICD-10-CM | POA: Insufficient documentation

## 2012-10-21 DIAGNOSIS — R1110 Vomiting, unspecified: Secondary | ICD-10-CM | POA: Insufficient documentation

## 2012-10-21 DIAGNOSIS — J45909 Unspecified asthma, uncomplicated: Secondary | ICD-10-CM | POA: Insufficient documentation

## 2012-10-21 NOTE — ED Notes (Signed)
Not in waiting room x 2 

## 2012-10-21 NOTE — ED Notes (Signed)
Patient is alert and oriented x3.  He states that he started vomiting on Sunday and it had slowed on Monday. The vomiting has returned today.  He adds that he can not hold water down and is at the point of dry heaves  Currently with yellow mucous on vomiting.  Current pain rated 8 of 10 generalized.

## 2012-11-18 ENCOUNTER — Other Ambulatory Visit: Payer: Self-pay | Admitting: *Deleted

## 2012-11-18 MED ORDER — ALPRAZOLAM 1 MG PO TABS: 1.0000 mg | ORAL_TABLET | Freq: Two times a day (BID) | ORAL | Status: DC

## 2012-11-18 NOTE — Telephone Encounter (Signed)
Have nurse call in rx for xanax

## 2012-11-18 NOTE — Telephone Encounter (Signed)
Last filled 09/12/12 with 1RF, last seen 06/27/12 by [facility]. If approved have nurse call into [facility]

## 2012-11-18 NOTE — Telephone Encounter (Signed)
Called to [facility] 

## 2012-11-19 ENCOUNTER — Other Ambulatory Visit: Payer: Self-pay | Admitting: *Deleted

## 2012-11-19 MED ORDER — OLANZAPINE 5 MG PO TABS: 5.0000 mg | ORAL_TABLET | Freq: Every day | ORAL | Status: DC

## 2012-11-19 NOTE — Telephone Encounter (Signed)
Patient last seen in January for acute visit. Please advise

## 2012-11-27 NOTE — Telephone Encounter (Signed)
Call returned.

## 2013-01-16 ENCOUNTER — Other Ambulatory Visit: Payer: Self-pay | Admitting: *Deleted

## 2013-01-16 NOTE — Telephone Encounter (Signed)
Patient last seen in office on 06-27-12 for an acute visit. Rx last filled on 12-11-12  For #60. If approved please have nurse phone in to [facility]. Thank you

## 2013-01-18 MED ORDER — ALPRAZOLAM 1 MG PO TABS: 1.0000 mg | ORAL_TABLET | Freq: Two times a day (BID) | ORAL | Status: DC

## 2013-01-18 NOTE — Telephone Encounter (Signed)
Please call in rx for xanax with 2 refills 

## 2013-01-20 NOTE — Telephone Encounter (Signed)
Called in rx to pharmacy.

## 2013-01-24 ENCOUNTER — Other Ambulatory Visit: Payer: Self-pay | Admitting: Nurse Practitioner

## 2013-03-12 ENCOUNTER — Other Ambulatory Visit: Payer: Self-pay

## 2013-03-13 ENCOUNTER — Other Ambulatory Visit: Payer: Self-pay | Admitting: Nurse Practitioner

## 2013-03-16 NOTE — Telephone Encounter (Signed)
Last seen 06/27/12  [facility]

## 2013-03-17 NOTE — Telephone Encounter (Signed)
Patient needs to be seen. Has exceeded time since last visit. Needs to bring all medications to next appointment. Last refill. 

## 2013-04-06 ENCOUNTER — Other Ambulatory Visit: Payer: Self-pay | Admitting: Nurse Practitioner

## 2013-04-08 NOTE — Telephone Encounter (Signed)
Not seen here since 06/27/12 with CJ

## 2013-05-09 ENCOUNTER — Other Ambulatory Visit: Payer: Self-pay | Admitting: Family Medicine

## 2013-05-18 ENCOUNTER — Other Ambulatory Visit: Payer: Self-pay | Admitting: Family Medicine

## 2013-05-19 NOTE — Telephone Encounter (Signed)
Last seen 06/27/12  [facility]

## 2013-06-20 ENCOUNTER — Other Ambulatory Visit: Payer: Self-pay | Admitting: Nurse Practitioner

## 2013-06-23 ENCOUNTER — Other Ambulatory Visit: Payer: Self-pay | Admitting: *Deleted

## 2013-06-23 MED ORDER — OMEPRAZOLE 40 MG PO CPDR: DELAYED_RELEASE_CAPSULE | ORAL | Status: DC

## 2013-07-03 ENCOUNTER — Ambulatory Visit: Payer: Self-pay | Admitting: Nurse Practitioner

## 2013-07-10 ENCOUNTER — Encounter: Payer: Self-pay | Admitting: Family Medicine

## 2013-07-10 ENCOUNTER — Ambulatory Visit (INDEPENDENT_AMBULATORY_CARE_PROVIDER_SITE_OTHER): Payer: BC Managed Care – PPO | Admitting: Family Medicine

## 2013-07-10 VITALS — BP 108/72 | HR 80 | Temp 99.8°F | Ht 68.0 in | Wt 171.8 lb

## 2013-07-10 DIAGNOSIS — R52 Pain, unspecified: Secondary | ICD-10-CM

## 2013-07-10 DIAGNOSIS — J209 Acute bronchitis, unspecified: Secondary | ICD-10-CM

## 2013-07-10 DIAGNOSIS — J029 Acute pharyngitis, unspecified: Secondary | ICD-10-CM

## 2013-07-10 LAB — POCT INFLUENZA A/B
Influenza A, POC: NEGATIVE
Influenza B, POC: NEGATIVE

## 2013-07-10 LAB — POCT RAPID STREP A (OFFICE): Rapid Strep A Screen: NEGATIVE

## 2013-07-10 MED ORDER — AMOXICILLIN 875 MG PO TABS: 875.0000 mg | ORAL_TABLET | Freq: Two times a day (BID) | ORAL | Status: DC

## 2013-07-10 MED ORDER — OSELTAMIVIR PHOSPHATE 75 MG PO CAPS: 75.0000 mg | ORAL_CAPSULE | Freq: Two times a day (BID) | ORAL | Status: DC

## 2013-07-10 MED ORDER — METHYLPREDNISOLONE (PAK) 4 MG PO TABS: ORAL_TABLET | ORAL | Status: DC

## 2013-07-10 MED ORDER — BENZONATATE 100 MG PO CAPS: 200.0000 mg | ORAL_CAPSULE | Freq: Three times a day (TID) | ORAL | Status: DC | PRN

## 2013-07-10 NOTE — Progress Notes (Signed)
   Subjective:    Patient ID: Douglas EasterlyJoey Koch, male    DOB: 10/17/1980, 33 y.o.   MRN: [id number]  HPI This 33 y.o. male presents for evaluation of sore throat and fever.  He has Been feeling very tired.   Review of Systems No chest pain, SOB, HA, dizziness, vision change, N/V, diarrhea, constipation, dysuria, urinary urgency or frequency, myalgias, arthralgias or rash.     Objective:   Physical Exam Vital signs noted  Well developed well nourished male.  HEENT - Head atraumatic Normocephalic                Eyes - PERRLA, Conjuctiva - clear Sclera- Clear EOMI                Ears - EAC's Wnl TM's Wnl Gross Hearing WNL                Nose - Nares patent                 Throat - oropharanx injected tonsils 2 plus Respiratory - Lungs CTA bilateral Cardiac - RRR S1 and S2 without murmur GI - Abdomen soft Nontender and bowel sounds active x 4 Extremities - No edema. Neuro - Grossly intact.       Assessment & Plan:  Sore throat - Plan: POCT rapid strep A, oseltamivir (TAMIFLU) 75 MG capsule, methylPREDNIsolone (MEDROL DOSPACK) 4 MG tablet, amoxicillin (AMOXIL) 875 MG tablet, benzonatate (TESSALON PERLES) 100 MG capsule  Body aches - Plan: POCT Influenza A/B, POCT rapid strep A, oseltamivir (TAMIFLU) 75 MG capsule, methylPREDNIsolone (MEDROL DOSPACK) 4 MG tablet, amoxicillin (AMOXIL) 875 MG tablet, benzonatate (TESSALON PERLES) 100 MG capsule  Acute bronchitis - Plan: oseltamivir (TAMIFLU) 75 MG capsule, methylPREDNIsolone (MEDROL DOSPACK) 4 MG tablet, amoxicillin (AMOXIL) 875 MG tablet, benzonatate (TESSALON PERLES) 100 MG capsule  Push po fluids, rest, tylenol and motrin otc prn as directed for fever, arthralgias, and myalgias.  Follow up prn if sx's continue or persist.

## 2013-07-16 ENCOUNTER — Telehealth: Payer: Self-pay | Admitting: Nurse Practitioner

## 2013-07-18 ENCOUNTER — Ambulatory Visit (INDEPENDENT_AMBULATORY_CARE_PROVIDER_SITE_OTHER): Payer: BC Managed Care – PPO | Admitting: Family Medicine

## 2013-07-18 VITALS — BP 160/100 | HR 60 | Temp 97.5°F | Ht 68.0 in | Wt 161.6 lb

## 2013-07-18 DIAGNOSIS — R112 Nausea with vomiting, unspecified: Secondary | ICD-10-CM | POA: Insufficient documentation

## 2013-07-18 DIAGNOSIS — R109 Unspecified abdominal pain: Secondary | ICD-10-CM | POA: Insufficient documentation

## 2013-07-18 NOTE — Progress Notes (Signed)
Patient ID: Douglas Koch, male   DOB: [birth year], 33 y.o.   MRN: [id number] SUBJECTIVE: CC: Chief Complaint  Patient presents with  . Emesis    HPI: Has had bowel problems with vomiting and bloating ever few months. H/o pancreatitis in the past.h/o alcoholism in the past. Still smokes. Has been vomiting and weak.  Past Medical History  Diagnosis Date  . Ulcer   . Pancreatitis   . Alcohol abuse   . Anxiety   . Arthritis   . Asthma   . Depression   . H. pylori infection   . Gastritis    Past Surgical History  Procedure Laterality Date  . Thumb surgery      right hand  . Finger surgery      left pinky  . Esophagogastroduodenoscopy  01/15/2012    Procedure: ESOPHAGOGASTRODUODENOSCOPY (EGD);  Surgeon: Louis Meckel, MD;  Location: [facility];  Service: Endoscopy;  Laterality: N/A;   History   Social History  . Marital Status: Married    Spouse Name: N/A    Number of Children: 3  . Years of Education: N/A   Occupational History  . stone [employer]   Social History Main Topics  . Smoking status: Current Every Day Smoker    Types: Cigarettes  . Smokeless tobacco: Never Used  . Alcohol Use: Yes  . Drug Use: No  . Sexual Activity: Not on file   Other Topics Concern  . Not on file   Social History Narrative  . No narrative on file   Family History  Problem Relation Age of Onset  . Liver disease Maternal Aunt   . Stomach cancer Maternal Aunt   . Diabetes Maternal Aunt   . Diabetes Maternal Grandfather   . Heart disease Paternal Grandfather     MI   Current Outpatient Prescriptions on File Prior to Visit  Medication Sig Dispense Refill  . ALPRAZolam (XANAX) 1 MG tablet Take 1 tablet (1 mg total) by mouth 2 (two) times daily.  60 tablet  2  . citalopram (CELEXA) 20 MG tablet Take 20 mg by mouth daily.      . INVEGA 3 MG 24 hr tablet       . OLANZapine (ZYPREXA) 5 MG tablet TAKE ONE TABLET AT BEDTIME  30 tablet  2  . omeprazole (PRILOSEC) 40  MG capsule TAKE (1) CAPSULE DAILY  30 capsule  0  . SUBOXONE 8-2 MG FILM       . amoxicillin (AMOXIL) 875 MG tablet Take 1 tablet (875 mg total) by mouth 2 (two) times daily.  20 tablet  0  . benzonatate (TESSALON PERLES) 100 MG capsule Take 2 capsules (200 mg total) by mouth 3 (three) times daily as needed for cough.  30 capsule  1  . methylPREDNIsolone (MEDROL DOSPACK) 4 MG tablet follow package directions  21 tablet  0  . oseltamivir (TAMIFLU) 75 MG capsule Take 1 capsule (75 mg total) by mouth 2 (two) times daily.  10 capsule  0   No current facility-administered medications on file prior to visit.   No Known Allergies  There is no immunization history on file for this patient. Prior to Admission medications   Medication Sig Start Date End Date Taking? Authorizing Provider  ALPRAZolam Prudy Feeler) 1 MG tablet Take 1 tablet (1 mg total) by mouth 2 (two) times daily. 01/16/13  Yes Mary-Margaret Daphine Deutscher, FNP  citalopram (CELEXA) 20 MG tablet Take 20 mg by mouth daily.  Yes Historical Provider, MD  INVEGA 3 MG 24 hr tablet  06/20/13  Yes Historical Provider, MD  OLANZapine (ZYPREXA) 5 MG tablet TAKE ONE TABLET AT BEDTIME 04/06/13  Yes Mary-Margaret Daphine DeutscherMartin, FNP  omeprazole (PRILOSEC) 40 MG capsule TAKE (1) CAPSULE DAILY 06/23/13  Yes Mary-Margaret Daphine DeutscherMartin, FNP  SUBOXONE 8-2 MG FILM  07/02/13  Yes Historical Provider, MD  amoxicillin (AMOXIL) 875 MG tablet Take 1 tablet (875 mg total) by mouth 2 (two) times daily. 07/10/13   Deatra CanterWilliam J Oxford, FNP  benzonatate (TESSALON PERLES) 100 MG capsule Take 2 capsules (200 mg total) by mouth 3 (three) times daily as needed for cough. 07/10/13   Deatra CanterWilliam J Oxford, FNP  methylPREDNIsolone (MEDROL DOSPACK) 4 MG tablet follow package directions 07/10/13   Deatra CanterWilliam J Oxford, FNP  oseltamivir (TAMIFLU) 75 MG capsule Take 1 capsule (75 mg total) by mouth 2 (two) times daily. 07/10/13   Deatra CanterWilliam J Oxford, FNP     ROS: As above in the HPI. All other systems are stable or  negative.  OBJECTIVE: APPEARANCE:  Patient in no acute distress.The patient appeared well nourished and normally developed. Acyanotic. Waist: VITAL SIGNS:BP 160/100  Pulse 60  Temp(Src) 97.5 F (36.4 C) (Oral)  Ht 5\' 8"  (1.727 m)  Wt 161 lb 9.6 oz (73.301 kg)  BMI 24.58 kg/m2 WM looks  Sick.  SKIN: warm and  Dry without overt rashes, tattoos and scars  HEAD and Neck: without JVD, Head and scalp: normal Eyes:No scleral icterus. Fundi normal, eye movements normal. Ears: Auricle normal, canal normal, Tympanic membranes normal, insufflation normal. Nose: normal Throat: normal Neck & thyroid: normal  CHEST & LUNGS: Chest wall: normal Lungs: Clear  CVS: Reveals the PMI to be normally located. Regular rhythm, First and Second Heart sounds are normal,  absence of murmurs, rubs or gallops. Peripheral vasculature: Radial pulses: normal Dorsal pedis pulses: normal Posterior pulses: normal  ABDOMEN:  Appearance: distension. increased bs. Mild bilateral  Tenderness of the abdomen, no organomegaly, no masses, no Abdominal Aortic enlargement. No Guarding , no rebound. No Bruits. Bowel sounds: increased  RECTAL: N/A GU: N/A  EXTREMETIES: nonedematous.  MUSCULOSKELETAL:  Spine: normal Joints: intact  NEUROLOGIC: oriented to time,place and person; nonfocal. Strength is normal Sensory is normal Reflexes are normal Cranial Nerves are normal.  ASSESSMENT: Abdominal pain, unspecified site  Nausea with vomiting  PLAN: Needs to be  Evaluated in the ED setting. Wife will take him there today.  No orders of the defined types were placed in this encounter.   No orders of the defined types were placed in this encounter.   There are no discontinued medications. Return if symptoms worsen or fail to improve.  Deven Audi P. Modesto CharonWong, M.D.

## 2013-07-21 NOTE — Telephone Encounter (Signed)
[facility]

## 2013-07-24 ENCOUNTER — Other Ambulatory Visit: Payer: Self-pay | Admitting: Nurse Practitioner

## 2013-08-10 ENCOUNTER — Telehealth: Payer: Self-pay | Admitting: Nurse Practitioner

## 2013-08-11 ENCOUNTER — Other Ambulatory Visit: Payer: Self-pay | Admitting: Nurse Practitioner

## 2013-08-11 MED ORDER — OLANZAPINE 5 MG PO TABS: ORAL_TABLET | ORAL | Status: DC

## 2013-08-11 NOTE — Telephone Encounter (Signed)
Sent ib 1 month supply NTBS for refill

## 2013-08-11 NOTE — Telephone Encounter (Signed)
Patients wife aware

## 2013-09-29 ENCOUNTER — Telehealth: Payer: Self-pay | Admitting: *Deleted

## 2013-09-29 NOTE — Telephone Encounter (Signed)
Wife called and states that he has same symptoms as their baby that was seen yesterday. Wants to know if something can be called in . Please advise

## 2013-09-29 NOTE — Telephone Encounter (Signed)
NTBS.

## 2013-10-01 NOTE — Telephone Encounter (Signed)
Spoke with wife and she stated "he will not come in for ov and he is working" advised if symptoms persist to call for appt. Understanding verbalized

## 2013-12-17 ENCOUNTER — Ambulatory Visit (INDEPENDENT_AMBULATORY_CARE_PROVIDER_SITE_OTHER): Payer: BC Managed Care – PPO | Admitting: Physician Assistant

## 2013-12-17 ENCOUNTER — Encounter: Payer: Self-pay | Admitting: Physician Assistant

## 2013-12-17 ENCOUNTER — Ambulatory Visit (INDEPENDENT_AMBULATORY_CARE_PROVIDER_SITE_OTHER): Payer: BC Managed Care – PPO

## 2013-12-17 VITALS — BP 122/72 | HR 77 | Temp 98.0°F | Ht 68.0 in | Wt 172.6 lb

## 2013-12-17 DIAGNOSIS — F4323 Adjustment disorder with mixed anxiety and depressed mood: Secondary | ICD-10-CM

## 2013-12-17 DIAGNOSIS — R9389 Abnormal findings on diagnostic imaging of other specified body structures: Secondary | ICD-10-CM

## 2013-12-17 DIAGNOSIS — F319 Bipolar disorder, unspecified: Secondary | ICD-10-CM

## 2013-12-17 MED ORDER — PALIPERIDONE ER 3 MG PO TB24: 3.0000 mg | ORAL_TABLET | Freq: Every day | ORAL | Status: DC

## 2013-12-17 MED ORDER — OMEPRAZOLE 40 MG PO CPDR: DELAYED_RELEASE_CAPSULE | ORAL | Status: DC

## 2013-12-17 MED ORDER — ALPRAZOLAM 1 MG PO TABS: 1.0000 mg | ORAL_TABLET | Freq: Three times a day (TID) | ORAL | Status: DC | PRN

## 2013-12-17 NOTE — Progress Notes (Signed)
Subjective:     Patient ID: Douglas Koch, male   DOB: 12/14/1980, 33 y.o.   MRN: [id number]  HPI Pt here to f/u He has been a pt here and through behavioral Health Hx of bipolar and prescrip med abuse States they have successfully weaned him of the of prescrip pain meds  Pt missed an appt and has been released form their care Since he has been weaned he would like to cont f/u here for maint. meds Pt also with h[facility] of [facility] and needs f/u  Review of Systems  Constitutional: Negative.   Psychiatric/Behavioral: Positive for decreased concentration. Negative for suicidal ideas, behavioral problems, confusion, sleep disturbance, self-injury, dysphoric mood and agitation. The patient is nervous/anxious. The patient is not hyperactive.        Objective:   Physical Exam  Nursing note and vitals reviewed. Constitutional: He appears well-developed and well-nourished.  Neck: Neck supple. No JVD present.  Cardiovascular: Normal rate, regular rhythm, normal heart sounds and intact distal pulses.   Pulmonary/Chest: Effort normal and breath sounds normal.  Lymphadenopathy:    He has no cervical adenopathy.  CXR- no acute findings     Assessment:     Anxiety/Bipolar Dz Hx of Prescrip pain pill abuse Abnl CXR    Plan:     Prilosec,Invega were refilled today Xanax # 90 rf done today Pt and wife instructed this could not be rf'd early and would require monthly f/u Pt aware unable to rx other med and need to f/u with Psych for this Will inform of CXR results

## 2013-12-17 NOTE — Patient Instructions (Signed)
Generalized Anxiety Disorder  Generalized anxiety disorder (GAD) is a mental disorder. It interferes with life functions, including relationships, work, and school.  GAD is different from normal anxiety, which everyone experiences at some point in their lives in response to specific life events and activities. Normal anxiety actually helps us prepare for and get through these life events and activities. Normal anxiety goes away after the event or activity is over.   GAD causes anxiety that is not necessarily related to specific events or activities. It also causes excess anxiety in proportion to specific events or activities. The anxiety associated with GAD is also difficult to control. GAD can vary from mild to severe. People with severe GAD can have intense waves of anxiety with physical symptoms (panic attacks).   SYMPTOMS  The anxiety and worry associated with GAD are difficult to control. This anxiety and worry are related to many life events and activities and also occur more days than not for 6 months or longer. People with GAD also have three or more of the following symptoms (one or more in children):  · Restlessness.    · Fatigue.  · Difficulty concentrating.    · Irritability.  · Muscle tension.  · Difficulty sleeping or unsatisfying sleep.  DIAGNOSIS  GAD is diagnosed through an assessment by your caregiver. Your caregiver will ask you questions about your mood, physical symptoms, and events in your life. Your caregiver may ask you about your medical history and use of alcohol or drugs, including prescription medications. Your caregiver may also do a physical exam and blood tests. Certain medical conditions and the use of certain substances can cause symptoms similar to those associated with GAD. Your caregiver may refer you to a mental health specialist for further evaluation.  TREATMENT  The following therapies are usually used to treat GAD:   · Medication--Antidepressant medication usually is  prescribed for long-term daily control. Antianxiety medications may be added in severe cases, especially when panic attacks occur.    · Talk therapy (psychotherapy)--Certain types of talk therapy can be helpful in treating GAD by providing support, education, and guidance. A form of talk therapy called cognitive behavioral therapy can teach you healthy ways to think about and react to daily life events and activities.  · Stress management techniques--These include yoga, meditation, and exercise and can be very helpful when they are practiced regularly.  A mental health specialist can help determine which treatment is best for you. Some people see improvement with one therapy. However, other people require a combination of therapies.  Document Released: 10/06/2012 Document Reviewed: 10/06/2012  Exit[employer]® Patient Information ©2015 [employer]. This information is not intended to replace advice given to you by your health care Douglas Koch. Make sure you discuss any questions you have with your health care Douglas Koch.

## 2013-12-22 ENCOUNTER — Telehealth: Payer: Self-pay

## 2013-12-23 NOTE — Telephone Encounter (Signed)
No Answer/No Answering Machine

## 2014-01-19 ENCOUNTER — Other Ambulatory Visit: Payer: Self-pay | Admitting: Physician Assistant

## 2014-01-20 NOTE — Telephone Encounter (Signed)
Last seen 12/17/13  [facility]  IF approved route to nurse to [facility]

## 2014-02-22 ENCOUNTER — Other Ambulatory Visit: Payer: Self-pay | Admitting: Family Medicine

## 2014-02-23 NOTE — Telephone Encounter (Signed)
Called to [facility]  

## 2014-02-23 NOTE — Telephone Encounter (Signed)
Please call in xanax with 1 refills 

## 2014-02-23 NOTE — Telephone Encounter (Signed)
Patient last seen in office on 12-17-13. Rx last filled 01-23-14 for #90. Please advise.  If approved please route to pool B so nurse can phone in to pharmacy

## 2014-04-20 ENCOUNTER — Other Ambulatory Visit: Payer: Self-pay | Admitting: Nurse Practitioner

## 2014-04-21 ENCOUNTER — Telehealth: Payer: Self-pay | Admitting: Nurse Practitioner

## 2014-04-22 ENCOUNTER — Telehealth: Payer: Self-pay | Admitting: *Deleted

## 2014-04-22 MED ORDER — ALPRAZOLAM 1 MG PO TABS: ORAL_TABLET | ORAL | Status: DC

## 2014-04-22 NOTE — Telephone Encounter (Signed)
Refill approved but will need to pick up rx.

## 2014-05-26 ENCOUNTER — Ambulatory Visit (INDEPENDENT_AMBULATORY_CARE_PROVIDER_SITE_OTHER): Payer: BC Managed Care – PPO | Admitting: Family Medicine

## 2014-05-26 ENCOUNTER — Encounter: Payer: Self-pay | Admitting: Family Medicine

## 2014-05-26 ENCOUNTER — Other Ambulatory Visit: Payer: Self-pay | Admitting: Family Medicine

## 2014-05-26 VITALS — BP 100/66 | HR 97 | Temp 97.5°F | Ht 68.0 in | Wt 174.6 lb

## 2014-05-26 DIAGNOSIS — F418 Other specified anxiety disorders: Secondary | ICD-10-CM

## 2014-05-26 MED ORDER — ALPRAZOLAM 1 MG PO TABS: ORAL_TABLET | ORAL | Status: DC

## 2014-05-26 NOTE — Progress Notes (Signed)
   Subjective:    Patient ID: Douglas Koch, male    DOB: 08/07/1980, 33 y.o.   MRN: [id number]  HPI Pattient is here for refills on his xanax.  He is on suboxone and invega and this is rx'd by psychiatry.    Review of Systems  Constitutional: Negative for fever.  HENT: Negative for ear pain.   Eyes: Negative for discharge.  Respiratory: Negative for cough.   Cardiovascular: Negative for chest pain.  Gastrointestinal: Negative for abdominal distention.  Endocrine: Negative for polyuria.  Genitourinary: Negative for difficulty urinating.  Musculoskeletal: Negative for gait problem and neck pain.  Skin: Negative for color change and rash.  Neurological: Negative for speech difficulty and headaches.  Psychiatric/Behavioral: Negative for agitation.       Objective:    BP 100/66 mmHg  Pulse 97  Temp(Src) 97.5 F (36.4 C) (Oral)  Ht 5\' 8"  (1.727 m)  Wt 174 lb 9.6 oz (79.198 kg)  BMI 26.55 kg/m2 Physical Exam  Constitutional: He is oriented to person, place, and time. He appears well-developed and well-nourished.  HENT:  Head: Normocephalic and atraumatic.  Mouth/Throat: Oropharynx is clear and moist.  Eyes: Pupils are equal, round, and reactive to light.  Neck: Normal range of motion. Neck supple.  Cardiovascular: Normal rate and regular rhythm.   No murmur heard. Pulmonary/Chest: Effort normal and breath sounds normal.  Abdominal: Soft. Bowel sounds are normal. There is no tenderness.  Neurological: He is alert and oriented to person, place, and time.  Skin: Skin is warm and dry.  Psychiatric: He has a normal mood and affect.          Assessment & Plan:     ICD-9-CM ICD-10-CM   1. Other specified anxiety disorders 300.09 F41.8    Refill xanax only follow up with psychiatry for other meds.  No Follow-up on file.  Deatra CanterWilliam J Oxford FNP

## 2014-05-31 MED ORDER — OMEPRAZOLE 40 MG PO CPDR: DELAYED_RELEASE_CAPSULE | ORAL | Status: DC

## 2014-05-31 NOTE — Telephone Encounter (Signed)
Mom will ask MM to fill invega.

## 2014-06-17 NOTE — Telephone Encounter (Signed)
appt scheduled

## 2014-06-29 ENCOUNTER — Other Ambulatory Visit: Payer: Self-pay | Admitting: Physician Assistant

## 2014-06-29 NOTE — Telephone Encounter (Signed)
Last seen 05/26/14 B Oxford 

## 2014-06-30 ENCOUNTER — Other Ambulatory Visit: Payer: Self-pay | Admitting: Family Medicine

## 2014-07-01 ENCOUNTER — Other Ambulatory Visit: Payer: Self-pay | Admitting: Physician Assistant

## 2014-07-07 ENCOUNTER — Other Ambulatory Visit: Payer: Self-pay | Admitting: Nurse Practitioner

## 2014-07-07 MED ORDER — PALIPERIDONE ER 3 MG PO TB24: 3.0000 mg | ORAL_TABLET | Freq: Every day | ORAL | Status: DC

## 2014-09-28 ENCOUNTER — Other Ambulatory Visit: Payer: Self-pay | Admitting: Family Medicine

## 2014-09-29 NOTE — Telephone Encounter (Signed)
Last seen 05/26/14 B Oxford  If approved route to nurse [facility]

## 2014-09-30 NOTE — Telephone Encounter (Signed)
Please call in xanax with 1 refills 

## 2014-09-30 NOTE — Telephone Encounter (Signed)
rx called into pharmacy

## 2014-11-09 ENCOUNTER — Telehealth: Payer: Self-pay | Admitting: Family Medicine
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
## 2014-12-01 ENCOUNTER — Other Ambulatory Visit: Payer: Self-pay | Admitting: Nurse Practitioner

## 2014-12-01 ENCOUNTER — Encounter: Payer: Self-pay | Admitting: *Deleted

## 2014-12-01 ENCOUNTER — Encounter: Payer: Self-pay | Admitting: Physician Assistant

## 2014-12-01 ENCOUNTER — Ambulatory Visit (INDEPENDENT_AMBULATORY_CARE_PROVIDER_SITE_OTHER): Payer: BLUE CROSS/BLUE SHIELD | Admitting: Physician Assistant

## 2014-12-01 VITALS — BP 116/77 | HR 72 | Temp 96.4°F | Ht 68.0 in | Wt 164.4 lb

## 2014-12-01 DIAGNOSIS — F329 Major depressive disorder, single episode, unspecified: Secondary | ICD-10-CM | POA: Diagnosis not present

## 2014-12-01 DIAGNOSIS — F32A Depression, unspecified: Secondary | ICD-10-CM

## 2014-12-01 DIAGNOSIS — L309 Dermatitis, unspecified: Secondary | ICD-10-CM | POA: Diagnosis not present

## 2014-12-01 LAB — POCT SKIN KOH: Skin KOH, POC: POSITIVE

## 2014-12-01 NOTE — Progress Notes (Signed)
Subjective:     Patient ID: Douglas Koch, male   DOB: 07/02/1980, 34 y.o.   MRN: [id number]  HPI Pt with rash to the R lower leg for several month Rash will be pruritic but no pain They have used OTC creams but sx continue Pt also with a hx of depression/anxiety He has noticed a decrease in his usual activities and his libido They have restarted the Celexa 20mg  that they have at home  Review of Systems  Constitutional: Positive for activity change and fatigue.  Skin: Positive for rash.  Psychiatric/Behavioral: Positive for dysphoric mood and decreased concentration. Negative for suicidal ideas, hallucinations, confusion, sleep disturbance and self-injury. The patient is not nervous/anxious and is not hyperactive.        Objective:   Physical Exam  Skin:  Scaly erythem rash to the ant L mid tibia area No surrounding induration No ulceration or drainage noted Skin scraping done + KOH  Psychiatric: He has a normal mood and affect. His behavior is normal. Judgment and thought content normal.  Nursing note and vitals reviewed.      Assessment:     Dermatitis- fungal Depression    Plan:     They have full bottle of Celexa at home Restart on the 20mg  SE again reviewed F/U in 3 wks regarding depression OTC Clotrimazole to the rash bid Nl course reviewed

## 2014-12-01 NOTE — Patient Instructions (Signed)

## 2014-12-02 NOTE — Telephone Encounter (Signed)
Last seen 12/01/14 WLW  IF approved route to [facility]

## 2014-12-31 ENCOUNTER — Other Ambulatory Visit: Payer: Self-pay | Admitting: Nurse Practitioner

## 2014-12-31 NOTE — Telephone Encounter (Signed)
Last seen 12/01/14 WLW  If approved route to [facility]

## 2015-01-03 ENCOUNTER — Other Ambulatory Visit: Payer: Self-pay

## 2015-01-03 MED ORDER — ALPRAZOLAM 1 MG PO TABS: ORAL_TABLET | ORAL | Status: DC

## 2015-01-03 NOTE — Telephone Encounter (Signed)
[facility] 

## 2015-01-03 NOTE — Telephone Encounter (Signed)
Last seen 12/01/14  WLW   If approved route to nurse [facility]

## 2015-01-03 NOTE — Telephone Encounter (Signed)
Please call in alprazolam  1 po TID #90  with 0 refills

## 2015-03-04 ENCOUNTER — Other Ambulatory Visit: Payer: Self-pay

## 2015-03-04 MED ORDER — OMEPRAZOLE 40 MG PO CPDR: DELAYED_RELEASE_CAPSULE | ORAL | Status: DC

## 2015-03-07 ENCOUNTER — Other Ambulatory Visit: Payer: Self-pay | Admitting: Nurse Practitioner

## 2015-03-08 NOTE — Telephone Encounter (Signed)
Last seen 12/01/14 by Montey Hora, last filled 02/01/15. [facility]

## 2015-03-08 NOTE — Telephone Encounter (Signed)
Patient NTBS for follow up and lab work Refill denied

## 2015-03-09 NOTE — Telephone Encounter (Signed)
Na to inform pt of needing to be seen before refill can be done

## 2015-03-10 ENCOUNTER — Ambulatory Visit: Payer: BLUE CROSS/BLUE SHIELD | Admitting: Nurse Practitioner

## 2015-03-14 ENCOUNTER — Ambulatory Visit: Payer: BLUE CROSS/BLUE SHIELD | Admitting: Nurse Practitioner

## 2015-03-22 ENCOUNTER — Encounter: Payer: Self-pay | Admitting: Family Medicine

## 2015-03-22 ENCOUNTER — Ambulatory Visit (INDEPENDENT_AMBULATORY_CARE_PROVIDER_SITE_OTHER): Payer: BLUE CROSS/BLUE SHIELD | Admitting: Family Medicine

## 2015-03-22 VITALS — BP 119/71 | HR 80 | Temp 97.3°F | Ht 68.0 in | Wt 164.0 lb

## 2015-03-22 DIAGNOSIS — F319 Bipolar disorder, unspecified: Secondary | ICD-10-CM | POA: Insufficient documentation

## 2015-03-22 DIAGNOSIS — F3130 Bipolar disorder, current episode depressed, mild or moderate severity, unspecified: Secondary | ICD-10-CM

## 2015-03-22 MED ORDER — ALPRAZOLAM 1 MG PO TABS: ORAL_TABLET | ORAL | Status: DC

## 2015-03-22 MED ORDER — SERTRALINE HCL 50 MG PO TABS: 50.0000 mg | ORAL_TABLET | Freq: Every day | ORAL | Status: DC

## 2015-03-22 NOTE — Progress Notes (Signed)
   Subjective:    Patient ID: Douglas Koch, male    DOB: 1981-06-24, 34 y.o.   MRN: [id number]  HPI  Pt is here for medication refill for Xanax and discuss sleeping problems. Patient has a history of bipolar disorder and takes invecta as a mood stabilizer. According to my research assistant be used more as an acute adjunct for mood stabilization but he has been on it long enough and wife says there is a definite difference in his personality off of it but I am reluctant to stop it. He is having problems with depression and sleep disturbance and I think he needs to be on antidepressive as well as a mood stabilizer for his bipolar disease.     Patient Active Problem List   Diagnosis Date Noted  . Nausea with vomiting 07/18/2013  . Abdominal pain, unspecified site 07/18/2013  . Abdominal pain, epigastric 01/09/2012   Outpatient Encounter Prescriptions as of 03/22/2015  Medication Sig  . ALPRAZolam (XANAX) 1 MG tablet TAKE 1 TABLET THREE TIMES A DAY AS NEEDED FOR ANXIETY  . omeprazole (PRILOSEC) 40 MG capsule TAKE (1) CAPSULE DAILY  . paliperidone (INVEGA) 3 MG 24 hr tablet TAKE 1 TABLET EVERY DAY  . [DISCONTINUED] SUBOXONE 8-2 MG FILM    No facility-administered encounter medications on file as of 03/22/2015.          Review of Systems  Constitutional: Negative.   HENT: Negative.   Eyes: Negative.   Respiratory: Negative.   Cardiovascular: Negative.   Gastrointestinal: Negative.   Endocrine: Negative.   Genitourinary: Negative.   Musculoskeletal: Negative.   Skin: Negative.   Allergic/Immunologic: Negative.   Neurological: Negative.   Hematological: Negative.   Psychiatric/Behavioral: Negative.        Objective:   Physical Exam  Constitutional: He is oriented to person, place, and time. He appears well-developed and well-nourished.  Cardiovascular: Normal rate and regular rhythm.   Pulmonary/Chest: Effort normal and breath sounds normal.  Neurological: He is alert and  oriented to person, place, and time.  Psychiatric: He has a normal mood and affect. His behavior is normal. Judgment and thought content normal.    BP 119/71 mmHg  Pulse 80  Temp(Src) 97.3 F (36.3 C) (Oral)  Ht  (1.727 m)  Wt 164 lb (74.39 kg)  BMI 24.94 kg/m2       Assessment & Plan:  1. Bipolar disorder, most recent episode depressed, remission status unspecified Will add Zoloft to current mood stabilizer and refill Xanax  Frederica Kuster MD

## 2015-05-07 ENCOUNTER — Other Ambulatory Visit: Payer: Self-pay | Admitting: Nurse Practitioner

## 2015-05-09 NOTE — Telephone Encounter (Signed)
Who originally rx this needs to see psych to get in the future.

## 2015-05-20 ENCOUNTER — Other Ambulatory Visit: Payer: Self-pay | Admitting: Nurse Practitioner

## 2015-05-20 MED ORDER — ALPRAZOLAM 1 MG PO TABS
ORAL_TABLET | ORAL | Status: DC
Start: 2015-05-20 — End: 2015-10-20

## 2015-05-20 NOTE — Telephone Encounter (Signed)
Please give enough Xanax to do until the patient can come in and be seen again by a provider for a refill

## 2015-05-20 NOTE — Telephone Encounter (Signed)
Last seen 03/22/15 Dr Miller  If approved route to [facility] 

## 2015-05-20 NOTE — Addendum Note (Signed)
Addended by: Magdalene RiverBULLINS, Camiyah Friberg H on: 05/20/2015 03:04 PM   Modules accepted: Orders

## 2015-06-05 ENCOUNTER — Other Ambulatory Visit: Payer: Self-pay | Admitting: Family Medicine

## 2015-06-12 ENCOUNTER — Other Ambulatory Visit: Payer: Self-pay | Admitting: Family Medicine

## 2015-06-22 ENCOUNTER — Encounter: Payer: Self-pay | Admitting: Family Medicine

## 2015-06-22 ENCOUNTER — Ambulatory Visit (INDEPENDENT_AMBULATORY_CARE_PROVIDER_SITE_OTHER): Payer: BLUE CROSS/BLUE SHIELD | Admitting: Family Medicine

## 2015-06-22 ENCOUNTER — Encounter (INDEPENDENT_AMBULATORY_CARE_PROVIDER_SITE_OTHER): Payer: Self-pay

## 2015-06-22 VITALS — BP 122/75 | HR 76 | Temp 97.4°F | Ht 68.0 in | Wt 168.2 lb

## 2015-06-22 DIAGNOSIS — J0100 Acute maxillary sinusitis, unspecified: Secondary | ICD-10-CM | POA: Diagnosis not present

## 2015-06-22 DIAGNOSIS — F3175 Bipolar disorder, in partial remission, most recent episode depressed: Secondary | ICD-10-CM

## 2015-06-22 DIAGNOSIS — J019 Acute sinusitis, unspecified: Secondary | ICD-10-CM | POA: Insufficient documentation

## 2015-06-22 MED ORDER — DOXYCYCLINE HYCLATE 100 MG PO TABS: 100.0000 mg | ORAL_TABLET | Freq: Two times a day (BID) | ORAL | Status: DC

## 2015-06-22 MED ORDER — ALPRAZOLAM 1 MG PO TABS
1.0000 mg | ORAL_TABLET | Freq: Two times a day (BID) | ORAL | Status: DC | PRN
Start: 2015-06-22 — End: 2015-10-20

## 2015-06-22 MED ORDER — ALPRAZOLAM 1 MG PO TABS: 1.0000 mg | ORAL_TABLET | Freq: Two times a day (BID) | ORAL | Status: DC | PRN

## 2015-06-22 MED ORDER — TERBINAFINE HCL 1 % EX CREA: 1.0000 "application " | TOPICAL_CREAM | Freq: Every day | CUTANEOUS | Status: DC

## 2015-06-22 MED ORDER — CITALOPRAM HYDROBROMIDE 20 MG PO TABS: 20.0000 mg | ORAL_TABLET | Freq: Every day | ORAL | Status: DC

## 2015-06-22 NOTE — Progress Notes (Signed)
   Subjective:    Patient ID: Douglas EasterlyJoey Koch, male    DOB: 07/31/1980, 34 y.o.   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 He has a history of bipolar disorder and takes [place]. He was started on Zoloft for his last visit but stopped it on his own because it made him sleepy and more moody. There are several other concerns. He has had a cough for about 2 months. Cough is productive of colored sputum. He is a smoker.  Been some sinus symptoms such as congestion and drainage. He also complains of a dry patch on the glans of penis area there is been no exposure to any STDs by history. He does sweat a lot wearing thermal's under his outer close and this might be contributing to this lesion as well as to the athlete's foot which he has chronically.  Patient Active Problem List   Diagnosis Date Noted  . Bipolar disorder (HCC) 03/22/2015  . Nausea with vomiting 07/18/2013  . Abdominal pain, unspecified site 07/18/2013  . Abdominal pain, epigastric 01/09/2012   Outpatient Encounter Prescriptions as of 06/22/2015  Medication Sig  . ALPRAZolam (XANAX) 1 MG tablet TAKE ONE TABLET BY MOUTH 3 TIMES DAILY AS NEEDED FOR ANXIETY  . omeprazole (PRILOSEC) 40 MG capsule TAKE (1) CAPSULE DAILY  . paliperidone (INVEGA) 3 MG 24 hr tablet TAKE 1 TABLET EVERY DAY  . sertraline (ZOLOFT) 50 MG tablet Take 1 tablet (50 mg total) by mouth daily.  . [DISCONTINUED] omeprazole (PRILOSEC) 40 MG capsule TAKE (1) CAPSULE DAILY   No facility-administered encounter medications on file as of 06/22/2015.      Review of Systems  Constitutional: Negative.   HENT: Negative.   Respiratory: Positive for cough.   Cardiovascular: Negative.   Neurological: Negative.   Psychiatric/Behavioral: Positive for dysphoric mood. The patient is nervous/anxious.    diagnosis     Objective:   Physical Exam  Constitutional: He is oriented to person, place, and time. He  appears well-developed and well-nourished.  HENT:  There is thick yellowish green drainage visible in the posterior pharynx  Pulmonary/Chest: Effort normal. He has wheezes.  Genitourinary:  Her small area on the glans looks like dry skin but very well could be eczematous, fungal, or remote possibility of Bowen's disease  Neurological: He is alert and oriented to person, place, and time.          Assessment & Plan:  1. Bipolar disorder, in partial remission, most recent episode depressed (HCC) We'll try different antidepressant, citalopram 20 mg in hopes of using with mood stabilizer. So refilled alprazolam 1 mg he takes this 3 times a day. Rx given for 3 months  2. Acute maxillary sinusitis, recurrence not specified Rx doxycycline, albuterol. Encouraged stop smoking  3 skin lesion (penis) We will try treatment with Lamisil. If symptoms do not resolve consider dermal referral to rule out Bowen's  Frederica KusterStephen M Miller MD

## 2015-06-23 ENCOUNTER — Other Ambulatory Visit: Payer: Self-pay | Admitting: Nurse Practitioner

## 2015-06-24 ENCOUNTER — Telehealth: Payer: Self-pay | Admitting: *Deleted

## 2015-06-24 MED ORDER — ALBUTEROL SULFATE HFA 108 (90 BASE) MCG/ACT IN AERS: 1.0000 | INHALATION_SPRAY | Freq: Four times a day (QID) | RESPIRATORY_TRACT | Status: DC | PRN

## 2015-06-24 NOTE — Telephone Encounter (Signed)
Xanax prescription was written for BID # 90. He normally takes them TID. Spoke with Dr Hyacinth MeekerMiller and clarified that it is supposed to be TID. Called pharmacy and instructed them to change the directions on the 2 held prescriptions to TID. Patient is aware that he can take them TID and that the other scripts were changed.  He also was expecting an albuterol prescription which has now been sent in per verbal order by Dr Hyacinth MeekerMiller.

## 2015-07-20 ENCOUNTER — Telehealth: Payer: Self-pay | Admitting: Family Medicine

## 2015-07-26 NOTE — Telephone Encounter (Signed)
Several attempts have been made to contact patient. This encounter will be closed.  

## 2015-08-23 ENCOUNTER — Other Ambulatory Visit: Payer: Self-pay | Admitting: Family Medicine

## 2015-09-19 ENCOUNTER — Other Ambulatory Visit: Payer: Self-pay | Admitting: Family Medicine

## 2015-09-20 ENCOUNTER — Other Ambulatory Visit: Payer: Self-pay

## 2015-09-20 MED ORDER — ALPRAZOLAM 1 MG PO TABS: 1.0000 mg | ORAL_TABLET | Freq: Three times a day (TID) | ORAL | Status: DC | PRN

## 2015-09-20 NOTE — Telephone Encounter (Signed)
Patient of Dr Hyacinth MeekerMiller. Due to be filled tomorrow. Last office visit was 06-22-15. If approved please route to Pool A so [facility]

## 2015-09-20 NOTE — Telephone Encounter (Signed)
addressed this am in other request  Murtis SinkSam Ariabella Brien, MD [facility] 09/20/2015, 1:13 PM

## 2015-09-20 NOTE — Telephone Encounter (Signed)
Last seen 06/22/15  Dr Hyacinth MeekerMiller  If approved route to [facility]

## 2015-10-20 ENCOUNTER — Encounter: Payer: Self-pay | Admitting: Family Medicine

## 2015-10-20 ENCOUNTER — Encounter (INDEPENDENT_AMBULATORY_CARE_PROVIDER_SITE_OTHER): Payer: Self-pay

## 2015-10-20 ENCOUNTER — Ambulatory Visit (INDEPENDENT_AMBULATORY_CARE_PROVIDER_SITE_OTHER): Payer: BLUE CROSS/BLUE SHIELD | Admitting: Family Medicine

## 2015-10-20 VITALS — BP 114/76 | HR 77 | Temp 97.9°F | Ht 68.0 in | Wt 173.0 lb

## 2015-10-20 DIAGNOSIS — F3175 Bipolar disorder, in partial remission, most recent episode depressed: Secondary | ICD-10-CM | POA: Diagnosis not present

## 2015-10-20 MED ORDER — PALIPERIDONE ER 3 MG PO TB24: 3.0000 mg | ORAL_TABLET | Freq: Every day | ORAL | Status: DC

## 2015-10-20 MED ORDER — ALPRAZOLAM 1 MG PO TABS: 1.0000 mg | ORAL_TABLET | Freq: Two times a day (BID) | ORAL | Status: DC | PRN

## 2015-10-20 NOTE — Progress Notes (Signed)
   Subjective:    Patient ID: Douglas EasterlyJoey Koch, male    DOB: 08/25/1980, 35 y.o.   MRN: [id number]  HPI 35 year old man with bipolar disorder. He takes paliperidone, Xanax, and an antidepressant he is unsure whether it's citalopram or sertraline. Symptoms of been well controlled on this regimen. A former practitioner started the paliperidone and even though it is indicated for schizophrenia it appears to be working well with him.   Review of Systems  Constitutional: Negative.   HENT: Negative.   Eyes: Negative.   Respiratory: Negative.  Negative for shortness of breath.   Cardiovascular: Negative.  Negative for chest pain and leg swelling.  Gastrointestinal: Negative.   Genitourinary: Negative.   Musculoskeletal: Negative.   Skin: Negative.   Neurological: Negative.   Psychiatric/Behavioral: Negative.   All other systems reviewed and are negative.      Objective:   Physical Exam  Constitutional: He is oriented to person, place, and time. He appears well-developed and well-nourished.  HENT:  Head: Normocephalic.  Right Ear: External ear normal.  Left Ear: External ear normal.  Nose: Nose normal.  Mouth/Throat: Oropharynx is clear and moist.  Eyes: Conjunctivae and EOM are normal. Pupils are equal, round, and reactive to light.  Neck: Normal range of motion. Neck supple.  Cardiovascular: Normal rate, regular rhythm, normal heart sounds and intact distal pulses.   Pulmonary/Chest: Effort normal and breath sounds normal.  Abdominal: Soft. Bowel sounds are normal.  Musculoskeletal: Normal range of motion.  Neurological: He is alert and oriented to person, place, and time.  Skin: Skin is warm and dry.  Psychiatric: He has a normal mood and affect. His behavior is normal. Judgment and thought content normal.          Assessment & Plan:  1. Bipolar disorder, in partial remission, most recent episode depressed (HCC) Refill prescriptions for paliperidone and Xanax. Will call back  t  Frederica KusterStephen M Quinterious Walraven MDomorrow with the antidepressant that he takes which should be either Zoloft or Celexa.

## 2015-10-21 ENCOUNTER — Ambulatory Visit: Payer: BLUE CROSS/BLUE SHIELD | Admitting: Family Medicine

## 2015-11-25 ENCOUNTER — Ambulatory Visit (INDEPENDENT_AMBULATORY_CARE_PROVIDER_SITE_OTHER): Payer: BLUE CROSS/BLUE SHIELD | Admitting: Family

## 2015-11-25 ENCOUNTER — Ambulatory Visit (INDEPENDENT_AMBULATORY_CARE_PROVIDER_SITE_OTHER): Payer: BLUE CROSS/BLUE SHIELD

## 2015-11-25 ENCOUNTER — Encounter: Payer: Self-pay | Admitting: Family

## 2015-11-25 VITALS — BP 122/82 | HR 77 | Temp 98.5°F | Resp 16 | Wt 167.0 lb

## 2015-11-25 DIAGNOSIS — M25512 Pain in left shoulder: Secondary | ICD-10-CM

## 2015-11-25 DIAGNOSIS — M5412 Radiculopathy, cervical region: Secondary | ICD-10-CM

## 2015-11-25 MED ORDER — CYCLOBENZAPRINE HCL 10 MG PO TABS: 10.0000 mg | ORAL_TABLET | Freq: Three times a day (TID) | ORAL | Status: DC | PRN

## 2015-11-25 MED ORDER — PREDNISONE 10 MG (21) PO TBPK
10.0000 mg | ORAL_TABLET | Freq: Every day | ORAL | Status: DC
Start: 2015-11-25 — End: 2015-12-22

## 2015-11-25 MED ORDER — NAPROXEN 500 MG PO TABS: 500.0000 mg | ORAL_TABLET | Freq: Two times a day (BID) | ORAL | Status: DC

## 2015-11-25 NOTE — Patient Instructions (Signed)

## 2015-11-25 NOTE — Progress Notes (Signed)
   Subjective:    Patient ID: Douglas EasterlyJoey Sole, male    DOB: 12/24/1980, 35 y.o.   MRN: [id number]  Shoulder Pain  The pain is present in the left shoulder. This is a new problem. The current episode started 1 to 4 weeks ago. There has been no history of extremity trauma. The problem occurs constantly. The problem has been unchanged. The quality of the pain is described as aching. The pain is at a severity of 8/10. The pain is moderate. Associated symptoms include a limited range of motion, numbness, stiffness and tingling. Pertinent negatives include no inability to bear weight, joint locking or joint swelling. The symptoms are aggravated by activity. He has tried rest, NSAIDS and acetaminophen for the symptoms. The treatment provided mild relief.      Review of Systems  Musculoskeletal: Positive for stiffness.  Neurological: Positive for tingling and numbness.  All other systems reviewed and are negative.      Objective:   Physical Exam  Constitutional: He is oriented to person, place, and time. He appears well-developed and well-nourished. No distress.  HENT:  Head: Normocephalic.  Neck: Normal range of motion. Neck supple. No thyromegaly present.  Cardiovascular: Normal rate, regular rhythm, normal heart sounds and intact distal pulses.   No murmur heard. Pulmonary/Chest: Effort normal and breath sounds normal. No respiratory distress. He has no wheezes.  Abdominal: Soft. Bowel sounds are normal. He exhibits no distension. There is no tenderness.  Musculoskeletal: He exhibits no edema or tenderness.  Slow flexion of left arm related to pain   Neurological: He is alert and oriented to person, place, and time.  Skin: Skin is warm and dry. No rash noted. No erythema.  Psychiatric: He has a normal mood and affect. His behavior is normal. Judgment and thought content normal.  Vitals reviewed.   BP 122/82 mmHg  Pulse 77  Temp(Src) 98.5 F (36.9 C) (Oral)  Resp 16  Wt 167 lb (75.751  kg)   Left shoulder x-ray- WNL Preliminary reading by Jannifer Rodneyhristy Margaretmary Prisk, FNP [facility]     Assessment & Plan:  1. Left shoulder pain - DG Shoulder Left; Future  2. Cervical radiculitis -Rest -Ice and heat as needed -Sedation precaution discussed -Take naprosyn with food and for the next 7-10 days RTO prn  - predniSONE (STERAPRED UNI-PAK 21 TAB) 10 MG (21) TBPK tablet; Take 1 tablet (10 mg total) by mouth daily. As directed x 6 days  Dispense: 21 tablet; Refill: 0 - naproxen (NAPROSYN) 500 MG tablet; Take 1 tablet (500 mg total) by mouth 2 (two) times daily with a meal.  Dispense: 60 tablet; Refill: 1 - cyclobenzaprine (FLEXERIL) 10 MG tablet; Take 1 tablet (10 mg total) by mouth 3 (three) times daily as needed for muscle spasms.  Dispense: 30 tablet; Refill: 0  Jannifer Rodneyhristy Luismario Coston, FNP

## 2015-12-19 ENCOUNTER — Telehealth: Payer: Self-pay | Admitting: Family Medicine

## 2015-12-21 ENCOUNTER — Ambulatory Visit: Payer: BLUE CROSS/BLUE SHIELD | Admitting: Physician Assistant

## 2015-12-21 NOTE — Telephone Encounter (Signed)
appt scheduled Pt notified 

## 2015-12-22 ENCOUNTER — Ambulatory Visit (INDEPENDENT_AMBULATORY_CARE_PROVIDER_SITE_OTHER): Payer: BLUE CROSS/BLUE SHIELD | Admitting: Family

## 2015-12-22 ENCOUNTER — Encounter: Payer: Self-pay | Admitting: Family

## 2015-12-22 ENCOUNTER — Encounter: Payer: Self-pay | Admitting: Family Medicine

## 2015-12-22 ENCOUNTER — Ambulatory Visit: Payer: BLUE CROSS/BLUE SHIELD | Admitting: Family

## 2015-12-22 VITALS — BP 118/73 | HR 103 | Temp 98.0°F | Ht 68.0 in | Wt 175.4 lb

## 2015-12-22 DIAGNOSIS — M25512 Pain in left shoulder: Secondary | ICD-10-CM | POA: Diagnosis not present

## 2015-12-22 MED ORDER — BUPIVACAINE HCL 0.25 % IJ SOLN
1.0000 mL | Freq: Once | INTRAMUSCULAR | Status: AC
  Administered 2015-12-22: 1 mL via INTRA_ARTICULAR

## 2015-12-22 MED ORDER — METHYLPREDNISOLONE ACETATE 40 MG/ML IJ SUSP
40.0000 mg | Freq: Once | INTRAMUSCULAR | Status: AC
  Administered 2015-12-22: 40 mg via INTRA_ARTICULAR

## 2015-12-22 NOTE — Patient Instructions (Signed)

## 2015-12-22 NOTE — Progress Notes (Signed)
   Subjective:    Patient ID: Douglas Koch, male    DOB: 11/21/1980, 35 y.o.   MRN: [id number]  Shoulder Pain  The pain is present in the left shoulder and neck. This is a recurrent problem. The current episode started more than 1 month ago. There has been no history of extremity trauma. The problem occurs constantly. The problem has been unchanged. The quality of the pain is described as aching. The pain is at a severity of 9/10. The pain is moderate. Associated symptoms include a limited range of motion, numbness, stiffness and tingling. Pertinent negatives include no inability to bear weight or joint swelling. The symptoms are aggravated by activity. He has tried rest, NSAIDS and OTC pain meds for the symptoms. The treatment provided mild relief.      Review of Systems  Constitutional: Negative.   HENT: Negative.   Respiratory: Negative.   Cardiovascular: Negative.   Gastrointestinal: Negative.   Endocrine: Negative.   Genitourinary: Negative.   Musculoskeletal: Positive for joint swelling, stiffness and neck pain.  Neurological: Positive for tingling and numbness.  Hematological: Negative.   Psychiatric/Behavioral: Negative.   All other systems reviewed and are negative.      Objective:   Physical Exam  Constitutional: He is oriented to person, place, and time. He appears well-developed and well-nourished. No distress.  Neck: Normal range of motion. No thyromegaly present.  Cardiovascular: Normal rate, regular rhythm, normal heart sounds and intact distal pulses.   No murmur heard. Pulmonary/Chest: Effort normal and breath sounds normal. No respiratory distress. He has no wheezes.  Abdominal: Soft. Bowel sounds are normal. He exhibits no distension. There is no tenderness.  Musculoskeletal: He exhibits tenderness. He exhibits no edema.  Pain of left shoulder with flexion and extension  Neurological: He is alert and oriented to person, place, and time.  Skin: Skin is warm and  dry. No rash noted. No erythema.  Psychiatric: He has a normal mood and affect. His behavior is normal. Judgment and thought content normal.  Vitals reviewed.    BP 118/73 mmHg  Pulse 103  Temp(Src) 98 F (36.7 C) (Oral)  Ht 5\' 8"  (1.727 m)  Wt 175 lb 6.4 oz (79.561 kg)  BMI 26.68 kg/m2  leftshoulder prepped with betadine Injected with Marcaine .5% plain and methylprednisolone with 25 guage needle. Patient tolerated well.     Assessment & Plan:  1. Left shoulder pain -Rest -Ice -Continue naprosyn as needed RTO prn - bupivacaine (MARCAINE) 0.25 % (with pres) injection 1 mL; Inject 1 mL into the articular space once. - methylPREDNISolone acetate (DEPO-MEDROL) injection 40 mg; Inject 1 mL (40 mg total) into the articular space once.  Jannifer Rodneyhristy Dali Kraner, FNP

## 2015-12-26 ENCOUNTER — Telehealth: Payer: Self-pay

## 2015-12-26 DIAGNOSIS — M25512 Pain in left shoulder: Secondary | ICD-10-CM

## 2015-12-26 NOTE — Telephone Encounter (Signed)
Patient is still having pain in shoulder. Wants to know what to do next. Please advise

## 2015-12-26 NOTE — Telephone Encounter (Signed)
No answer no vm

## 2015-12-26 NOTE — Telephone Encounter (Signed)
Ortho referral sent 

## 2015-12-28 NOTE — Telephone Encounter (Signed)
Pt's wife notified of referral Verbalizes understanding

## 2016-01-12 DIAGNOSIS — M542 Cervicalgia: Secondary | ICD-10-CM | POA: Diagnosis not present

## 2016-01-12 DIAGNOSIS — M50321 Other cervical disc degeneration at C4-C5 level: Secondary | ICD-10-CM | POA: Diagnosis not present

## 2016-01-12 DIAGNOSIS — M5412 Radiculopathy, cervical region: Secondary | ICD-10-CM | POA: Diagnosis not present

## 2016-01-12 DIAGNOSIS — M792 Neuralgia and neuritis, unspecified: Secondary | ICD-10-CM | POA: Diagnosis not present

## 2016-01-19 ENCOUNTER — Other Ambulatory Visit: Payer: Self-pay | Admitting: Family Medicine

## 2016-01-20 ENCOUNTER — Other Ambulatory Visit: Payer: Self-pay

## 2016-01-20 MED ORDER — ALPRAZOLAM 1 MG PO TABS: 1.0000 mg | ORAL_TABLET | Freq: Two times a day (BID) | ORAL | 0 refills | Status: DC | PRN

## 2016-01-20 NOTE — Telephone Encounter (Signed)
Please review and advise.

## 2016-01-20 NOTE — Telephone Encounter (Signed)
Authorize 30 days only. Then contact the patient letting them know that they will need an appointment before any further prescriptions can be sent in. 

## 2016-01-20 NOTE — Telephone Encounter (Signed)
Na/ww 7/28

## 2016-01-23 DIAGNOSIS — M5412 Radiculopathy, cervical region: Secondary | ICD-10-CM | POA: Diagnosis not present

## 2016-01-26 DIAGNOSIS — M792 Neuralgia and neuritis, unspecified: Secondary | ICD-10-CM | POA: Diagnosis not present

## 2016-01-26 DIAGNOSIS — M5412 Radiculopathy, cervical region: Secondary | ICD-10-CM | POA: Diagnosis not present

## 2016-01-26 DIAGNOSIS — M50221 Other cervical disc displacement at C4-C5 level: Secondary | ICD-10-CM | POA: Diagnosis not present

## 2016-01-26 DIAGNOSIS — M5030 Other cervical disc degeneration, unspecified cervical region: Secondary | ICD-10-CM | POA: Diagnosis not present

## 2016-01-31 DIAGNOSIS — M50221 Other cervical disc displacement at C4-C5 level: Secondary | ICD-10-CM | POA: Diagnosis not present

## 2016-02-07 ENCOUNTER — Ambulatory Visit (INDEPENDENT_AMBULATORY_CARE_PROVIDER_SITE_OTHER): Payer: BLUE CROSS/BLUE SHIELD

## 2016-02-07 ENCOUNTER — Encounter: Payer: Self-pay | Admitting: Family Medicine

## 2016-02-07 ENCOUNTER — Ambulatory Visit (INDEPENDENT_AMBULATORY_CARE_PROVIDER_SITE_OTHER): Payer: BLUE CROSS/BLUE SHIELD | Admitting: Family Medicine

## 2016-02-07 VITALS — BP 106/62 | HR 96 | Temp 97.9°F | Ht 68.0 in | Wt 165.2 lb

## 2016-02-07 DIAGNOSIS — S63602A Unspecified sprain of left thumb, initial encounter: Secondary | ICD-10-CM

## 2016-02-07 DIAGNOSIS — M549 Dorsalgia, unspecified: Secondary | ICD-10-CM

## 2016-02-07 DIAGNOSIS — S6992XA Unspecified injury of left wrist, hand and finger(s), initial encounter: Secondary | ICD-10-CM

## 2016-02-07 MED ORDER — METHOCARBAMOL 500 MG PO TABS: 500.0000 mg | ORAL_TABLET | Freq: Four times a day (QID) | ORAL | 2 refills | Status: DC

## 2016-02-07 MED ORDER — DICLOFENAC SODIUM 75 MG PO TBEC: 75.0000 mg | DELAYED_RELEASE_TABLET | Freq: Two times a day (BID) | ORAL | 0 refills | Status: DC

## 2016-02-07 NOTE — Patient Instructions (Signed)
Great to meet you!  Wear the splint for comfort for approx 2 weeks, Try Ice 15 minutes, 3-4 times a day  I Have prescribed diclofenac for pain. Take 1 pill twice daily with food. I have also changed her muscle relaxer to Robaxin, do not take Flexeril with this.  Your thumb is sprained and may take a few weeks to get back to normal.
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
## 2016-02-12 DIAGNOSIS — Z041 Encounter for examination and observation following transport accident: Secondary | ICD-10-CM | POA: Diagnosis not present

## 2016-02-12 DIAGNOSIS — M79601 Pain in right arm: Secondary | ICD-10-CM | POA: Diagnosis not present

## 2016-02-12 DIAGNOSIS — F17210 Nicotine dependence, cigarettes, uncomplicated: Secondary | ICD-10-CM | POA: Diagnosis not present

## 2016-02-12 DIAGNOSIS — M79642 Pain in left hand: Secondary | ICD-10-CM | POA: Diagnosis not present

## 2016-02-12 DIAGNOSIS — Y9319 Activity, other involving water and watercraft: Secondary | ICD-10-CM | POA: Diagnosis not present

## 2016-02-12 DIAGNOSIS — S53491A Other sprain of right elbow, initial encounter: Secondary | ICD-10-CM | POA: Diagnosis not present

## 2016-02-12 DIAGNOSIS — M25521 Pain in right elbow: Secondary | ICD-10-CM | POA: Diagnosis not present

## 2016-02-12 DIAGNOSIS — M79631 Pain in right forearm: Secondary | ICD-10-CM | POA: Diagnosis not present

## 2016-02-15 DIAGNOSIS — M25521 Pain in right elbow: Secondary | ICD-10-CM | POA: Diagnosis not present

## 2016-02-15 DIAGNOSIS — S59901A Unspecified injury of right elbow, initial encounter: Secondary | ICD-10-CM | POA: Diagnosis not present

## 2016-02-15 DIAGNOSIS — M50221 Other cervical disc displacement at C4-C5 level: Secondary | ICD-10-CM | POA: Diagnosis not present

## 2016-02-15 DIAGNOSIS — M25621 Stiffness of right elbow, not elsewhere classified: Secondary | ICD-10-CM | POA: Diagnosis not present

## 2016-02-15 DIAGNOSIS — S52124A Nondisplaced fracture of head of right radius, initial encounter for closed fracture: Secondary | ICD-10-CM | POA: Diagnosis not present

## 2016-02-22 DIAGNOSIS — S59801D Other specified injuries of right elbow, subsequent encounter: Secondary | ICD-10-CM | POA: Diagnosis not present

## 2016-02-22 DIAGNOSIS — S52124D Nondisplaced fracture of head of right radius, subsequent encounter for closed fracture with routine healing: Secondary | ICD-10-CM | POA: Diagnosis not present

## 2016-02-22 DIAGNOSIS — M25621 Stiffness of right elbow, not elsewhere classified: Secondary | ICD-10-CM | POA: Diagnosis not present

## 2016-02-22 DIAGNOSIS — S59901D Unspecified injury of right elbow, subsequent encounter: Secondary | ICD-10-CM | POA: Diagnosis not present

## 2016-02-23 ENCOUNTER — Ambulatory Visit (INDEPENDENT_AMBULATORY_CARE_PROVIDER_SITE_OTHER): Payer: BLUE CROSS/BLUE SHIELD | Admitting: Family Medicine

## 2016-02-23 ENCOUNTER — Encounter: Payer: Self-pay | Admitting: Family Medicine

## 2016-02-23 VITALS — BP 112/75 | HR 84 | Temp 97.7°F | Ht 68.0 in | Wt 167.0 lb

## 2016-02-23 DIAGNOSIS — F3175 Bipolar disorder, in partial remission, most recent episode depressed: Secondary | ICD-10-CM | POA: Diagnosis not present

## 2016-02-23 MED ORDER — PALIPERIDONE ER 3 MG PO TB24: 3.0000 mg | ORAL_TABLET | Freq: Every day | ORAL | 2 refills | Status: DC

## 2016-02-23 MED ORDER — ALPRAZOLAM 1 MG PO TABS: 1.0000 mg | ORAL_TABLET | Freq: Three times a day (TID) | ORAL | 0 refills | Status: DC | PRN
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
## 2016-03-06 DIAGNOSIS — M50323 Other cervical disc degeneration at C6-C7 level: Secondary | ICD-10-CM | POA: Diagnosis not present

## 2016-03-06 DIAGNOSIS — M50322 Other cervical disc degeneration at C5-C6 level: Secondary | ICD-10-CM | POA: Diagnosis not present

## 2016-03-06 DIAGNOSIS — M5412 Radiculopathy, cervical region: Secondary | ICD-10-CM | POA: Diagnosis not present

## 2016-03-09 DIAGNOSIS — S52124D Nondisplaced fracture of head of right radius, subsequent encounter for closed fracture with routine healing: Secondary | ICD-10-CM | POA: Diagnosis not present

## 2016-03-09 DIAGNOSIS — M25621 Stiffness of right elbow, not elsewhere classified: Secondary | ICD-10-CM | POA: Diagnosis not present

## 2016-03-09 DIAGNOSIS — S59801D Other specified injuries of right elbow, subsequent encounter: Secondary | ICD-10-CM | POA: Diagnosis not present

## 2016-03-21 DIAGNOSIS — R109 Unspecified abdominal pain: Secondary | ICD-10-CM | POA: Diagnosis not present

## 2016-03-21 DIAGNOSIS — Z8719 Personal history of other diseases of the digestive system: Secondary | ICD-10-CM | POA: Diagnosis not present

## 2016-03-21 DIAGNOSIS — Z720 Tobacco use: Secondary | ICD-10-CM | POA: Diagnosis not present

## 2016-03-21 DIAGNOSIS — R112 Nausea with vomiting, unspecified: Secondary | ICD-10-CM | POA: Diagnosis not present

## 2016-03-22 ENCOUNTER — Other Ambulatory Visit: Payer: Self-pay | Admitting: Family Medicine

## 2016-03-22 NOTE — Telephone Encounter (Signed)
Last filled 02/23/16. If approved please route to pool and have nurse call into pharmacy

## 2016-03-27 DIAGNOSIS — M5412 Radiculopathy, cervical region: Secondary | ICD-10-CM | POA: Diagnosis not present

## 2016-03-29 ENCOUNTER — Ambulatory Visit: Payer: BLUE CROSS/BLUE SHIELD | Attending: Orthopedic Surgery | Admitting: Physical Therapy

## 2016-03-29 DIAGNOSIS — M542 Cervicalgia: Secondary | ICD-10-CM | POA: Diagnosis not present

## 2016-03-29 DIAGNOSIS — R293 Abnormal posture: Secondary | ICD-10-CM | POA: Diagnosis not present
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
## 2016-04-03 ENCOUNTER — Encounter: Payer: BLUE CROSS/BLUE SHIELD | Admitting: *Deleted

## 2016-04-10 ENCOUNTER — Ambulatory Visit: Payer: BLUE CROSS/BLUE SHIELD | Admitting: *Deleted

## 2016-04-12 DIAGNOSIS — R10819 Abdominal tenderness, unspecified site: Secondary | ICD-10-CM | POA: Diagnosis not present

## 2016-04-12 DIAGNOSIS — R531 Weakness: Secondary | ICD-10-CM | POA: Diagnosis not present

## 2016-04-12 DIAGNOSIS — R109 Unspecified abdominal pain: Secondary | ICD-10-CM | POA: Diagnosis not present

## 2016-04-12 DIAGNOSIS — Z8719 Personal history of other diseases of the digestive system: Secondary | ICD-10-CM | POA: Diagnosis not present

## 2016-04-12 DIAGNOSIS — Z720 Tobacco use: Secondary | ICD-10-CM | POA: Diagnosis not present

## 2016-04-12 DIAGNOSIS — R112 Nausea with vomiting, unspecified: Secondary | ICD-10-CM | POA: Diagnosis not present

## 2016-04-13 ENCOUNTER — Other Ambulatory Visit: Payer: Self-pay | Admitting: Family Medicine

## 2016-04-21 ENCOUNTER — Other Ambulatory Visit: Payer: Self-pay | Admitting: Family Medicine

## 2016-05-11 DIAGNOSIS — M5412 Radiculopathy, cervical region: Secondary | ICD-10-CM | POA: Diagnosis not present

## 2016-05-11 DIAGNOSIS — M4802 Spinal stenosis, cervical region: Secondary | ICD-10-CM | POA: Diagnosis not present

## 2016-05-21 ENCOUNTER — Other Ambulatory Visit: Payer: Self-pay | Admitting: Family Medicine

## 2016-05-22 NOTE — Telephone Encounter (Signed)
[facility] 

## 2016-05-24 ENCOUNTER — Ambulatory Visit: Payer: Self-pay | Admitting: Physician Assistant

## 2016-05-31 ENCOUNTER — Encounter: Payer: Self-pay | Admitting: Nurse Practitioner

## 2016-05-31 ENCOUNTER — Ambulatory Visit (INDEPENDENT_AMBULATORY_CARE_PROVIDER_SITE_OTHER): Payer: BLUE CROSS/BLUE SHIELD | Admitting: Nurse Practitioner

## 2016-05-31 ENCOUNTER — Ambulatory Visit (INDEPENDENT_AMBULATORY_CARE_PROVIDER_SITE_OTHER): Payer: BLUE CROSS/BLUE SHIELD

## 2016-05-31 VITALS — BP 128/74 | HR 75 | Temp 97.0°F | Ht 68.0 in | Wt 183.0 lb

## 2016-05-31 DIAGNOSIS — Z01818 Encounter for other preprocedural examination: Secondary | ICD-10-CM

## 2016-05-31 DIAGNOSIS — J411 Mucopurulent chronic bronchitis: Secondary | ICD-10-CM

## 2016-05-31 MED ORDER — FLUTICASONE-SALMETEROL 100-50 MCG/DOSE IN AEPB: 1.0000 | INHALATION_SPRAY | Freq: Two times a day (BID) | RESPIRATORY_TRACT | 3 refills | Status: DC

## 2016-05-31 MED ORDER — ALPRAZOLAM 1 MG PO TABS: ORAL_TABLET | ORAL | 1 refills | Status: DC

## 2016-05-31 NOTE — Progress Notes (Signed)
   Subjective:    Patient ID: Douglas Koch, male    DOB: 10/24/1980, 35 y.o.   MRN: [id number]  HPI Patient is scheduled for Anterior cervical disc fusion  At C4-6 on December 27,2017. Because he is a smoker the orthopedist wants him to have surgical clearance. He is doing well without complaints other than his ear feels stopped up.     Review of Systems  Constitutional: Negative.   HENT: Negative.   Respiratory: Negative.   Cardiovascular: Negative.   Gastrointestinal: Negative.   Genitourinary: Negative.   Musculoskeletal: Positive for neck pain.  Neurological: Negative.   Psychiatric/Behavioral: Negative.   All other systems reviewed and are negative.      Objective:   Physical Exam  Constitutional: He is oriented to person, place, and time. He appears well-developed and well-nourished. No distress.  Cardiovascular: Normal rate and regular rhythm.   Pulmonary/Chest: Effort normal and breath sounds normal.  Musculoskeletal:  Limited cervical spine movement due to pain on rotation and left arm weaker then right.  Neurological: He is alert and oriented to person, place, and time.  Skin: Skin is warm.  Psychiatric: He has a normal mood and affect. His behavior is normal. Judgment and thought content normal.    BP 128/74   Pulse 75   Temp 97 F (36.1 C) (Oral)   Ht 5\' 8"  (1.727 m)   Wt 183 lb (83 kg)   BMI 27.83 kg/m   Chest xray- chronic bronchitic Bea Graffchanges-Douglas Levie Owensby, FNP      Assessment & Plan:   1. Preoperative clearance   2. Mucopurulent chronic bronchitis (HCC)    Cleared for surgery- recommend breathing treatment just prior to surgery Added advair to meds- rinse mouth after use STOP SMOKING RTO prn  Douglas Daphine DeutscherMartin, FNP

## 2016-05-31 NOTE — Patient Instructions (Signed)
Steps to Quit Smoking Smoking tobacco can be bad for your health. It can also affect almost every organ in your body. Smoking puts you and people around you at risk for many serious long-lasting (chronic) diseases. Quitting smoking is hard, but it is one of the best things that you can do for your health. It is never too late to quit. What are the benefits of quitting smoking? When you quit smoking, you lower your risk for getting serious diseases and conditions. They can include:  Lung cancer or lung disease.  Heart disease.  Stroke.  Heart attack.  Not being able to have children (infertility).  Weak bones (osteoporosis) and broken bones (fractures). If you have coughing, wheezing, and shortness of breath, those symptoms may get better when you quit. You may also get sick less often. If you are pregnant, quitting smoking can help to lower your chances of having a baby of low birth weight. What can I do to help me quit smoking? Talk with your doctor about what can help you quit smoking. Some things you can do (strategies) include:  Quitting smoking totally, instead of slowly cutting back how much you smoke over a period of time.  Going to in-person counseling. You are more likely to quit if you go to many counseling sessions.  Using resources and support systems, such as:  Online chats with a counselor.  Phone quitlines.  Printed self-help materials.  Support groups or group counseling.  Text messaging programs.  Mobile phone apps or applications.  Taking medicines. Some of these medicines may have nicotine in them. If you are pregnant or breastfeeding, do not take any medicines to quit smoking unless your doctor says it is okay. Talk with your doctor about counseling or other things that can help you. Talk with your doctor about using more than one strategy at the same time, such as taking medicines while you are also going to in-person counseling. This can help make quitting  easier. What things can I do to make it easier to quit? Quitting smoking might feel very hard at first, but there is a lot that you can do to make it easier. Take these steps:  Talk to your family and friends. Ask them to support and encourage you.  Call phone quitlines, reach out to support groups, or work with a counselor.  Ask people who smoke to not smoke around you.  Avoid places that make you want (trigger) to smoke, such as:  Bars.  Parties.  Smoke-break areas at work.  Spend time with people who do not smoke.  Lower the stress in your life. Stress can make you want to smoke. Try these things to help your stress:  Getting regular exercise.  Deep-breathing exercises.  Yoga.  Meditating.  Doing a body scan. To do this, close your eyes, focus on one area of your body at a time from head to toe, and notice which parts of your body are tense. Try to relax the muscles in those areas.  Download or buy apps on your mobile phone or tablet that can help you stick to your quit plan. There are many free apps, such as QuitGuide from the CDC (Centers for Disease Control and Prevention). You can find more support from smokefree.gov and other websites. This information is not intended to replace advice given to you by your health care provider. Make sure you discuss any questions you have with your health care provider. Document Released: 04/07/2009 Document Revised: 02/07/2016 Document   Reviewed: 10/26/2014 Elsevier Interactive Patient Education  2017 Elsevier Inc.  

## 2016-06-01 DIAGNOSIS — R1110 Vomiting, unspecified: Secondary | ICD-10-CM | POA: Diagnosis not present

## 2016-06-01 DIAGNOSIS — R1084 Generalized abdominal pain: Secondary | ICD-10-CM | POA: Diagnosis not present

## 2016-06-02 DIAGNOSIS — R1084 Generalized abdominal pain: Secondary | ICD-10-CM | POA: Diagnosis not present

## 2016-06-12 NOTE — Pre-Procedure Instructions (Signed)
Douglas Koch  06/12/2016      [facility] [place] - [address] [place] [address] Phone: [phone] Fax: [phone]  [facility] - [place] - [address] [place] [address] Phone: [phone] Fax: [phone]  [id number] - [place] [place] - [address] [place] [id number] Phone: [phone] Fax: [phone]    Your procedure is scheduled on December 27  [facility] at 1100 A.M.  Call this number if you have problems the morning of surgery:  [phone]   Remember:  Do not eat food or drink liquids after midnight.   Take these medicines the morning of surgery with A SIP OF WATER acetaminophen (TYLENOL), ALPRAZolam (XANAX), citalopram (CELEXA), Fluticasone-Salmeterol (ADVAIR),  paliperidone (INVEGA),  PROAIR HFA, promethazine (PHENERGAN)  7 days prior to surgery STOP taking any Aspirin, Aleve, Naproxen, Ibuprofen, Motrin, Advil, Goody's, BC's, all herbal medications, fish oil, and all vitamins    Do not wear jewelry.  Do not wear lotions, powders, or cologne, or deoderant.  Men may shave face and neck.  Do not bring valuables to the hospital.  [facility] is not responsible for any belongings or valuables.  Contacts, dentures or bridgework may not be worn into surgery.  Leave your suitcase in the car.  After surgery it may be brought to your room.  For patients admitted to the hospital, discharge time will be determined by your treatment team.  Patients discharged the day of surgery will not be allowed to drive home.    Special instructions:   [place]- Preparing For Surgery  Before surgery, you can play an important role. Because skin is not sterile, your skin needs to be as free of germs as possible. You can reduce the number of germs on your skin by washing with CHG (chlorahexidine gluconate) Soap before  surgery.  CHG is an antiseptic cleaner which kills germs and bonds with the skin to continue killing germs even after washing.  Please do not use if you have an allergy to CHG or antibacterial soaps. If your skin becomes reddened/irritated stop using the CHG.  Do not shave (including legs and underarms) for at least 48 hours prior to first CHG shower. It is OK to shave your face.  Please follow these instructions carefully.   1. Shower the NIGHT BEFORE SURGERY and the MORNING OF SURGERY with CHG.   2. If you chose to wash your hair, wash your hair first as usual with your normal shampoo.  3. After you shampoo, rinse your hair and body thoroughly to remove the shampoo.  4. Use CHG as you would any other liquid soap. You can apply CHG directly to the skin and wash gently with a scrungie or a clean washcloth.   5. Apply the CHG Soap to your body ONLY FROM THE NECK DOWN.  Do not use on open wounds or open sores. Avoid contact with your eyes, ears, mouth and genitals (private parts). Wash genitals (private parts) with your normal soap.  6. Wash thoroughly, paying special attention to the area where your surgery will be performed.  7. Thoroughly rinse your body with warm water from the neck down.  8. DO NOT shower/wash with your normal soap after using and rinsing off the CHG Soap.  9. Pat yourself dry with a CLEAN TOWEL.   10. Wear CLEAN PAJAMAS   11.  Place CLEAN SHEETS on your bed the night of your first shower and DO NOT SLEEP WITH PETS.    Day of Surgery: Do not apply any deodorants/lotions. Please wear clean clothes to the hospital/surgery center.      Please read over the following fact sheets that you were given.

## 2016-06-13 ENCOUNTER — Encounter (HOSPITAL_COMMUNITY): Payer: Self-pay

## 2016-06-13 ENCOUNTER — Encounter (HOSPITAL_COMMUNITY)
Admission: RE | Admit: 2016-06-13 | Discharge: 2016-06-13 | Disposition: A | Discharge status: Home / Self Care | Payer: BLUE CROSS/BLUE SHIELD | Source: Ambulatory Visit | Attending: Orthopedic Surgery | Admitting: Orthopedic Surgery

## 2016-06-13 DIAGNOSIS — Z01812 Encounter for preprocedural laboratory examination: Secondary | ICD-10-CM | POA: Insufficient documentation

## 2016-06-13 DIAGNOSIS — M5020 Other cervical disc displacement, unspecified cervical region: Secondary | ICD-10-CM | POA: Insufficient documentation

## 2016-06-13 HISTORY — DX: Gastro-esophageal reflux disease without esophagitis: K21.9

## 2016-06-13 HISTORY — DX: Dyspnea, unspecified: R06.00

## 2016-06-13 HISTORY — DX: Bipolar disorder, unspecified: F31.9

## 2016-06-13 LAB — COMPREHENSIVE METABOLIC PANEL
ALT: 11 U/L — ABNORMAL LOW (ref 17–63)
AST: 21 U/L (ref 15–41)
Albumin: 4 g/dL (ref 3.5–5.0)
Alkaline Phosphatase: 62 U/L (ref 38–126)
Anion gap: 11 (ref 5–15)
BUN: 10 mg/dL (ref 6–20)
CO2: 23 mmol/L (ref 22–32)
Calcium: 9.1 mg/dL (ref 8.9–10.3)
Chloride: 105 mmol/L (ref 101–111)
Creatinine, Ser: 0.76 mg/dL (ref 0.61–1.24)
GFR calc Af Amer: >60 mL/min (ref >60)
GFR calc non Af Amer: >60 mL/min (ref >60)
Glucose, Bld: 134 mg/dL — ABNORMAL HIGH (ref 65–99)
Potassium: 3.7 mmol/L (ref 3.5–5.1)
Sodium: 139 mmol/L (ref 135–145)
Total Bilirubin: 0.5 mg/dL (ref 0.3–1.2)
Total Protein: 6.5 g/dL (ref 6.5–8.1)

## 2016-06-13 LAB — CBC
HCT: 41.9 % (ref 39.0–52.0)
Hemoglobin: 14.5 g/dL (ref 13.0–17.0)
MCH: 31 pg (ref 26.0–34.0)
MCHC: 34.6 g/dL (ref 30.0–36.0)
MCV: 89.7 fL (ref 78.0–100.0)
Platelets: 199 10*3/uL (ref 150–400)
RBC: 4.67 MIL/uL (ref 4.22–5.81)
RDW: 13 % (ref 11.5–15.5)
WBC: 6.4 10*3/uL (ref 4.0–10.5)

## 2016-06-13 LAB — SURGICAL PCR SCREEN
MRSA, PCR: NEGATIVE
Staphylococcus aureus: NEGATIVE

## 2016-06-13 NOTE — Progress Notes (Signed)
PCP: Bennie PieriniMary Margaret Martin NP @ [facility]--clearance in chart and epic.

## 2016-06-14 NOTE — Progress Notes (Signed)
Anesthesia Chart Review: Patient is a 35 year old male scheduled for C4-6 ACDF on 06/20/16 by Dr. Shon BatonBrooks.  History includes smoking, Bipolar disorder, depression, anxiety, GERD, asthma, exertional dyspnea, pancreatitis '12, gastritis, alcohol abuse (no current alcohol use documented), arthritis. He uses marijuana a few times a week per documentation. (He saw GI Dr. Genia HaroldBlake Scott 01/19/15 who thought he may have hyperemesis cannabinoid syndrome. CT at that time was not concerning for recurrent pancreatitis.)  PCP is listed as Dr. Jacalyn LefevreStephen Miller. He saw Mary-Margaret Daphine DeutscherMartin, NP on 05/31/16 for preoperative clearance. She did recommend a breathing treatment just prior to surgery. Smoking cessation advised.  Meds include Xanax, Celexa, Advair, Invega, ProAir, Goody's ES, promethazine.  BP (!) 107/53   Pulse 70   Temp 36.5 C   Resp 20   Ht 5\' 8"  (1.727 m)   Wt 177 lb 8 oz (80.5 kg)   SpO2 95%   BMI 26.99 kg/m   05/31/16 CXR: FINDINGS: Cardiomediastinal silhouette is stable. No acute infiltrate or pleural effusion. No pulmonary edema. Bony thorax is unremarkable. IMPRESSION: No active cardiopulmonary disease.  Normal abdominal U/S on 7/713.  Preoperative labs noted.  He has medical clearance. If no acute changes then I anticipate that he can proceed as planned.  Velna Ochsllison Advay Volante, PA-C [facility] Phone [phone] 06/14/2016 1:56 PM

## 2016-06-15 DIAGNOSIS — M50221 Other cervical disc displacement at C4-C5 level: Secondary | ICD-10-CM | POA: Diagnosis not present

## 2016-06-17 ENCOUNTER — Other Ambulatory Visit: Payer: Self-pay | Admitting: Family Medicine

## 2016-06-19 ENCOUNTER — Other Ambulatory Visit: Payer: Self-pay

## 2016-06-19 MED ORDER — CITALOPRAM HYDROBROMIDE 20 MG PO TABS: 20.0000 mg | ORAL_TABLET | Freq: Every day | ORAL | 0 refills | Status: DC

## 2016-06-20 ENCOUNTER — Observation Stay (HOSPITAL_COMMUNITY): Payer: BLUE CROSS/BLUE SHIELD

## 2016-06-20 ENCOUNTER — Encounter (HOSPITAL_COMMUNITY)
Admission: RE | Disposition: A | Discharge status: Home / Self Care | Payer: Self-pay | Source: Ambulatory Visit | Attending: Orthopedic Surgery

## 2016-06-20 ENCOUNTER — Ambulatory Visit (HOSPITAL_COMMUNITY): Payer: BLUE CROSS/BLUE SHIELD | Admitting: Vascular Surgery

## 2016-06-20 ENCOUNTER — Ambulatory Visit (HOSPITAL_COMMUNITY): Payer: BLUE CROSS/BLUE SHIELD

## 2016-06-20 ENCOUNTER — Encounter (HOSPITAL_COMMUNITY): Payer: Self-pay | Admitting: Certified Registered Nurse Anesthetist

## 2016-06-20 ENCOUNTER — Ambulatory Visit (HOSPITAL_COMMUNITY): Payer: BLUE CROSS/BLUE SHIELD | Admitting: Certified Registered Nurse Anesthetist

## 2016-06-20 ENCOUNTER — Observation Stay (HOSPITAL_COMMUNITY)
Admission: RE | Admit: 2016-06-20 | Discharge: 2016-06-21 | Disposition: A | Discharge status: Home / Self Care | Payer: BLUE CROSS/BLUE SHIELD | Source: Ambulatory Visit | Attending: Orthopedic Surgery | Admitting: Orthopedic Surgery

## 2016-06-20 DIAGNOSIS — F419 Anxiety disorder, unspecified: Secondary | ICD-10-CM | POA: Insufficient documentation

## 2016-06-20 DIAGNOSIS — F17210 Nicotine dependence, cigarettes, uncomplicated: Secondary | ICD-10-CM | POA: Diagnosis not present

## 2016-06-20 DIAGNOSIS — M50222 Other cervical disc displacement at C5-C6 level: Secondary | ICD-10-CM | POA: Diagnosis not present

## 2016-06-20 DIAGNOSIS — F319 Bipolar disorder, unspecified: Secondary | ICD-10-CM | POA: Insufficient documentation

## 2016-06-20 DIAGNOSIS — K219 Gastro-esophageal reflux disease without esophagitis: Secondary | ICD-10-CM | POA: Diagnosis not present

## 2016-06-20 DIAGNOSIS — J45909 Unspecified asthma, uncomplicated: Secondary | ICD-10-CM | POA: Insufficient documentation

## 2016-06-20 DIAGNOSIS — Z981 Arthrodesis status: Secondary | ICD-10-CM

## 2016-06-20 DIAGNOSIS — M2578 Osteophyte, vertebrae: Secondary | ICD-10-CM | POA: Insufficient documentation

## 2016-06-20 DIAGNOSIS — M1990 Unspecified osteoarthritis, unspecified site: Secondary | ICD-10-CM | POA: Diagnosis not present

## 2016-06-20 DIAGNOSIS — M542 Cervicalgia: Secondary | ICD-10-CM | POA: Diagnosis present

## 2016-06-20 DIAGNOSIS — F1290 Cannabis use, unspecified, uncomplicated: Secondary | ICD-10-CM | POA: Diagnosis not present

## 2016-06-20 DIAGNOSIS — M50121 Cervical disc disorder at C4-C5 level with radiculopathy: Secondary | ICD-10-CM | POA: Diagnosis present

## 2016-06-20 DIAGNOSIS — M50221 Other cervical disc displacement at C4-C5 level: Secondary | ICD-10-CM | POA: Diagnosis not present

## 2016-06-20 DIAGNOSIS — Z419 Encounter for procedure for purposes other than remedying health state, unspecified: Secondary | ICD-10-CM

## 2016-06-20 DIAGNOSIS — M50322 Other cervical disc degeneration at C5-C6 level: Secondary | ICD-10-CM | POA: Diagnosis not present

## 2016-06-20 DIAGNOSIS — M50321 Other cervical disc degeneration at C4-C5 level: Secondary | ICD-10-CM | POA: Diagnosis not present

## 2016-06-20 HISTORY — PX: ANTERIOR CERVICAL DECOMP/DISCECTOMY FUSION: SHX1161

## 2016-06-20 SURGERY — ANTERIOR CERVICAL DECOMPRESSION/DISCECTOMY FUSION 2 LEVELS
Anesthesia: General | Site: Spine Cervical

## 2016-06-20 MED ORDER — CEFAZOLIN SODIUM-DEXTROSE 2-4 GM/100ML-% IV SOLN
2.0000 g | INTRAVENOUS | Status: AC
  Administered 2016-06-20: 2 g via INTRAVENOUS
  Filled 2016-06-20: qty 100

## 2016-06-20 MED ORDER — 0.9 % SODIUM CHLORIDE (POUR BTL) OPTIME
TOPICAL | Status: DC | PRN
  Administered 2016-06-20: 1000 mL

## 2016-06-20 MED ORDER — HYDROMORPHONE HCL 1 MG/ML IJ SOLN
INTRAMUSCULAR | Status: AC
  Filled 2016-06-20: qty 0.5

## 2016-06-20 MED ORDER — CITALOPRAM HYDROBROMIDE 10 MG PO TABS
20.0000 mg | ORAL_TABLET | Freq: Every day | ORAL | Status: DC
  Administered 2016-06-20: 20 mg via ORAL
  Filled 2016-06-20 (×2): qty 2

## 2016-06-20 MED ORDER — FENTANYL CITRATE (PF) 100 MCG/2ML IJ SOLN
INTRAMUSCULAR | Status: AC
  Filled 2016-06-20: qty 2

## 2016-06-20 MED ORDER — ONDANSETRON HCL 4 MG/2ML IJ SOLN: 4.0000 mg | INTRAMUSCULAR | Status: DC | PRN

## 2016-06-20 MED ORDER — SODIUM CHLORIDE 0.9 % IV SOLN: 250.0000 mL | INTRAVENOUS | Status: DC

## 2016-06-20 MED ORDER — METHOCARBAMOL 500 MG PO TABS: 500.0000 mg | ORAL_TABLET | Freq: Three times a day (TID) | ORAL | 0 refills | Status: DC | PRN

## 2016-06-20 MED ORDER — DEXAMETHASONE 4 MG PO TABS
4.0000 mg | ORAL_TABLET | Freq: Four times a day (QID) | ORAL | Status: DC
  Administered 2016-06-21: 4 mg via ORAL
  Filled 2016-06-20 (×2): qty 1

## 2016-06-20 MED ORDER — SODIUM CHLORIDE 0.9% FLUSH: 3.0000 mL | INTRAVENOUS | Status: DC | PRN

## 2016-06-20 MED ORDER — DEXAMETHASONE SODIUM PHOSPHATE 10 MG/ML IJ SOLN
INTRAMUSCULAR | Status: DC | PRN
  Administered 2016-06-20: 10 mg via INTRAVENOUS

## 2016-06-20 MED ORDER — FENTANYL CITRATE (PF) 100 MCG/2ML IJ SOLN
INTRAMUSCULAR | Status: AC
  Filled 2016-06-20: qty 4

## 2016-06-20 MED ORDER — ONDANSETRON HCL 4 MG/2ML IJ SOLN
INTRAMUSCULAR | Status: DC | PRN
  Administered 2016-06-20: 4 mg via INTRAVENOUS

## 2016-06-20 MED ORDER — LIDOCAINE 2% (20 MG/ML) 5 ML SYRINGE
INTRAMUSCULAR | Status: AC
  Filled 2016-06-20: qty 5

## 2016-06-20 MED ORDER — ONDANSETRON HCL 4 MG PO TABS: 4.0000 mg | ORAL_TABLET | Freq: Three times a day (TID) | ORAL | 0 refills | Status: DC | PRN

## 2016-06-20 MED ORDER — LACTATED RINGERS IV SOLN
INTRAVENOUS | Status: DC
  Administered 2016-06-20 (×2): via INTRAVENOUS

## 2016-06-20 MED ORDER — DEXAMETHASONE SODIUM PHOSPHATE 4 MG/ML IJ SOLN
4.0000 mg | Freq: Four times a day (QID) | INTRAMUSCULAR | Status: DC
  Administered 2016-06-20 – 2016-06-21 (×3): 4 mg via INTRAVENOUS
  Filled 2016-06-20 (×4): qty 1

## 2016-06-20 MED ORDER — ACETAMINOPHEN 10 MG/ML IV SOLN
INTRAVENOUS | Status: DC | PRN
  Administered 2016-06-20: 1000 mg via INTRAVENOUS

## 2016-06-20 MED ORDER — ROCURONIUM BROMIDE 10 MG/ML (PF) SYRINGE
PREFILLED_SYRINGE | INTRAVENOUS | Status: AC
  Filled 2016-06-20: qty 5

## 2016-06-20 MED ORDER — ONDANSETRON HCL 4 MG/2ML IJ SOLN
INTRAMUSCULAR | Status: AC
Start: 2016-06-20 — End: 2016-06-20
  Filled 2016-06-20: qty 2

## 2016-06-20 MED ORDER — PALIPERIDONE ER 3 MG PO TB24
3.0000 mg | ORAL_TABLET | Freq: Every day | ORAL | Status: DC
Start: 2016-06-20 — End: 2016-06-21
  Administered 2016-06-21: 3 mg via ORAL
  Filled 2016-06-20 (×3): qty 1

## 2016-06-20 MED ORDER — SUGAMMADEX SODIUM 200 MG/2ML IV SOLN
INTRAVENOUS | Status: DC | PRN
  Administered 2016-06-20: 150 mg via INTRAVENOUS

## 2016-06-20 MED ORDER — SUGAMMADEX SODIUM 200 MG/2ML IV SOLN
INTRAVENOUS | Status: AC
  Filled 2016-06-20: qty 2

## 2016-06-20 MED ORDER — METHOCARBAMOL 1000 MG/10ML IJ SOLN
500.0000 mg | Freq: Four times a day (QID) | INTRAVENOUS | Status: DC | PRN
  Filled 2016-06-20: qty 5

## 2016-06-20 MED ORDER — METHOCARBAMOL 500 MG PO TABS
500.0000 mg | ORAL_TABLET | Freq: Four times a day (QID) | ORAL | Status: DC | PRN
  Administered 2016-06-21: 500 mg via ORAL
  Filled 2016-06-20 (×2): qty 1

## 2016-06-20 MED ORDER — CEFAZOLIN IN D5W 1 GM/50ML IV SOLN
1.0000 g | Freq: Three times a day (TID) | INTRAVENOUS | Status: AC
  Administered 2016-06-20 – 2016-06-21 (×2): 1 g via INTRAVENOUS
  Filled 2016-06-20 (×2): qty 50

## 2016-06-20 MED ORDER — OXYCODONE-ACETAMINOPHEN 10-325 MG PO TABS: 1.0000 | ORAL_TABLET | ORAL | 0 refills | Status: DC | PRN

## 2016-06-20 MED ORDER — BUPIVACAINE-EPINEPHRINE 0.25% -1:200000 IJ SOLN
INTRAMUSCULAR | Status: DC | PRN
  Administered 2016-06-20: 5 mL

## 2016-06-20 MED ORDER — BUPIVACAINE HCL (PF) 0.25 % IJ SOLN
INTRAMUSCULAR | Status: AC
  Filled 2016-06-20: qty 30

## 2016-06-20 MED ORDER — THROMBIN 20000 UNITS EX SOLR
CUTANEOUS | Status: AC
  Filled 2016-06-20: qty 20000

## 2016-06-20 MED ORDER — PROMETHAZINE HCL 25 MG/ML IJ SOLN: 6.2500 mg | INTRAMUSCULAR | Status: DC | PRN

## 2016-06-20 MED ORDER — ALBUTEROL SULFATE (2.5 MG/3ML) 0.083% IN NEBU
3.0000 mL | INHALATION_SOLUTION | Freq: Four times a day (QID) | RESPIRATORY_TRACT | Status: DC
  Administered 2016-06-20: 3 mL via RESPIRATORY_TRACT
  Filled 2016-06-20: qty 3

## 2016-06-20 MED ORDER — MENTHOL 3 MG MT LOZG
1.0000 | LOZENGE | OROMUCOSAL | Status: DC | PRN
  Filled 2016-06-20: qty 9

## 2016-06-20 MED ORDER — MORPHINE SULFATE (PF) 2 MG/ML IV SOLN
1.0000 mg | INTRAVENOUS | Status: DC | PRN
  Administered 2016-06-20 – 2016-06-21 (×5): 2 mg via INTRAVENOUS
  Filled 2016-06-20 (×5): qty 1

## 2016-06-20 MED ORDER — LACTATED RINGERS IV SOLN
INTRAVENOUS | Status: DC
  Administered 2016-06-20: 1 mL via INTRAVENOUS

## 2016-06-20 MED ORDER — PHENOL 1.4 % MT LIQD
1.0000 | OROMUCOSAL | Status: DC | PRN
  Administered 2016-06-21: 1 via OROMUCOSAL
  Filled 2016-06-20: qty 177

## 2016-06-20 MED ORDER — EPINEPHRINE PF 1 MG/ML IJ SOLN
INTRAMUSCULAR | Status: AC
  Filled 2016-06-20: qty 1

## 2016-06-20 MED ORDER — LIDOCAINE HCL (CARDIAC) 20 MG/ML IV SOLN
INTRAVENOUS | Status: DC | PRN
  Administered 2016-06-20: 100 mg via INTRAVENOUS

## 2016-06-20 MED ORDER — ROCURONIUM BROMIDE 100 MG/10ML IV SOLN
INTRAVENOUS | Status: DC | PRN
  Administered 2016-06-20 (×2): 10 mg via INTRAVENOUS
  Administered 2016-06-20: 50 mg via INTRAVENOUS
  Administered 2016-06-20 (×2): 10 mg via INTRAVENOUS

## 2016-06-20 MED ORDER — OXYCODONE HCL 5 MG PO TABS
10.0000 mg | ORAL_TABLET | ORAL | Status: DC | PRN
Start: 2016-06-20 — End: 2016-06-21
  Administered 2016-06-20 – 2016-06-21 (×4): 10 mg via ORAL
  Filled 2016-06-20 (×4): qty 2

## 2016-06-20 MED ORDER — HEMOSTATIC AGENTS (NO CHARGE) OPTIME
TOPICAL | Status: DC | PRN
  Administered 2016-06-20 (×2): 1 via TOPICAL

## 2016-06-20 MED ORDER — MIDAZOLAM HCL 5 MG/5ML IJ SOLN
INTRAMUSCULAR | Status: DC | PRN
  Administered 2016-06-20: 2 mg via INTRAVENOUS

## 2016-06-20 MED ORDER — FENTANYL CITRATE (PF) 100 MCG/2ML IJ SOLN
INTRAMUSCULAR | Status: DC | PRN
  Administered 2016-06-20 (×8): 50 ug via INTRAVENOUS

## 2016-06-20 MED ORDER — HYDROMORPHONE HCL 1 MG/ML IJ SOLN
0.2500 mg | INTRAMUSCULAR | Status: DC | PRN
  Administered 2016-06-20 (×3): 0.5 mg via INTRAVENOUS

## 2016-06-20 MED ORDER — SODIUM CHLORIDE 0.9% FLUSH: 3.0000 mL | Freq: Two times a day (BID) | INTRAVENOUS | Status: DC

## 2016-06-20 MED ORDER — ACETAMINOPHEN 10 MG/ML IV SOLN
INTRAVENOUS | Status: AC
  Filled 2016-06-20: qty 100

## 2016-06-20 MED ORDER — MIDAZOLAM HCL 2 MG/2ML IJ SOLN
INTRAMUSCULAR | Status: AC
  Filled 2016-06-20: qty 2

## 2016-06-20 MED ORDER — PROPOFOL 10 MG/ML IV BOLUS
INTRAVENOUS | Status: DC | PRN
  Administered 2016-06-20: 60 mg via INTRAVENOUS
  Administered 2016-06-20: 100 mg via INTRAVENOUS

## 2016-06-20 MED ORDER — THROMBIN 20000 UNITS EX KIT
PACK | CUTANEOUS | Status: DC | PRN
  Administered 2016-06-20: 20 mL via TOPICAL

## 2016-06-20 MED ORDER — DEXAMETHASONE SODIUM PHOSPHATE 10 MG/ML IJ SOLN
INTRAMUSCULAR | Status: AC
  Filled 2016-06-20: qty 1

## 2016-06-20 SURGICAL SUPPLY — 69 items
BIT DRILL SKYLINE 12MM (BIT) ×1 IMPLANT
BLADE SURG ROTATE 9660 (MISCELLANEOUS) IMPLANT
BONE VIVIGEN FORMABLE 5.4CC (Bone Implant) ×2 IMPLANT
BUR EGG ELITE 4.0 (BURR) IMPLANT
BUR MATCHSTICK NEURO 3.0 LAGG (BURR) IMPLANT
CAGE LORDOTIC 6 SM (Cage) ×2 IMPLANT
CANISTER SUCTION 2500CC (MISCELLANEOUS) ×2 IMPLANT
CLSR STERI-STRIP ANTIMIC 1/2X4 (GAUZE/BANDAGES/DRESSINGS) ×2 IMPLANT
CORDS BIPOLAR (ELECTRODE) ×2 IMPLANT
COVER MAYO STAND STRL (DRAPES) ×4 IMPLANT
COVER SURGICAL LIGHT HANDLE (MISCELLANEOUS) ×4 IMPLANT
CRADLE DONUT ADULT HEAD (MISCELLANEOUS) ×2 IMPLANT
DEVICE ENDSKLTN IMPLANT SM 7MM (Cage) ×1 IMPLANT
DRAPE C-ARM 42X72 X-RAY (DRAPES) ×2 IMPLANT
DRAPE HALF SHEET 40X57 (DRAPES) ×2 IMPLANT
DRAPE POUCH INSTRU U-SHP 10X18 (DRAPES) ×2 IMPLANT
DRAPE SURG 17X23 STRL (DRAPES) ×2 IMPLANT
DRAPE U-SHAPE 47X51 STRL (DRAPES) ×2 IMPLANT
DRILL BIT SKYLINE 12MM (BIT) ×1
DRSG MEPILEX BORDER 4X4 (GAUZE/BANDAGES/DRESSINGS) ×2 IMPLANT
DURAPREP 6ML APPLICATOR 50/CS (WOUND CARE) ×2 IMPLANT
ELECT COATED BLADE 2.86 ST (ELECTRODE) ×2 IMPLANT
ELECT PENCIL ROCKER SW 15FT (MISCELLANEOUS) ×2 IMPLANT
ELECT REM PT RETURN 9FT ADLT (ELECTROSURGICAL) ×2
ELECTRODE REM PT RTRN 9FT ADLT (ELECTROSURGICAL) ×1 IMPLANT
ENDOSKELETON IMPLANT SM 7MM (Cage) ×2 IMPLANT
FILTER STRAW FLUID ASPIR (MISCELLANEOUS) ×2 IMPLANT
GLOVE BIO SURGEON STRL SZ 6.5 (GLOVE) ×2 IMPLANT
GLOVE BIOGEL PI IND STRL 6.5 (GLOVE) ×1 IMPLANT
GLOVE BIOGEL PI IND STRL 8.5 (GLOVE) ×1 IMPLANT
GLOVE BIOGEL PI INDICATOR 6.5 (GLOVE) ×1
GLOVE BIOGEL PI INDICATOR 8.5 (GLOVE) ×1
GLOVE SS BIOGEL STRL SZ 8.5 (GLOVE) ×1 IMPLANT
GLOVE SUPERSENSE BIOGEL SZ 8.5 (GLOVE) ×1
GOWN STRL REUS W/ TWL XL LVL3 (GOWN DISPOSABLE) ×1 IMPLANT
GOWN STRL REUS W/TWL 2XL LVL3 (GOWN DISPOSABLE) ×4 IMPLANT
GOWN STRL REUS W/TWL XL LVL3 (GOWN DISPOSABLE) ×1
KIT BASIN OR (CUSTOM PROCEDURE TRAY) ×2 IMPLANT
KIT ROOM TURNOVER OR (KITS) ×2 IMPLANT
NEEDLE 18GX1X1/2 (RX/OR ONLY) (NEEDLE) ×2 IMPLANT
NEEDLE SPNL 18GX3.5 QUINCKE PK (NEEDLE) ×2 IMPLANT
NS IRRIG 1000ML POUR BTL (IV SOLUTION) ×2 IMPLANT
PACK ORTHO CERVICAL (CUSTOM PROCEDURE TRAY) ×2 IMPLANT
PACK UNIVERSAL I (CUSTOM PROCEDURE TRAY) IMPLANT
PAD ARMBOARD 7.5X6 YLW CONV (MISCELLANEOUS) ×4 IMPLANT
PATTIES SURGICAL .25X.25 (GAUZE/BANDAGES/DRESSINGS) IMPLANT
PIN DISTRACTION 14 (PIN) ×4 IMPLANT
PLATE TWO LEVEL SKYLINE 30MM (Plate) ×2 IMPLANT
RESTRAINT LIMB HOLDER UNIV (RESTRAINTS) ×2 IMPLANT
SCREW SELF DRILL SKYLINE 12MM (Screw) ×4 IMPLANT
SCREW SKYLINE 14MM SD-VA (Screw) ×8 IMPLANT
SPONGE INTESTINAL PEANUT (DISPOSABLE) ×2 IMPLANT
SPONGE LAP 4X18 X RAY DECT (DISPOSABLE) ×4 IMPLANT
SPONGE SURGIFOAM ABS GEL 100 (HEMOSTASIS) ×2 IMPLANT
SURGIFLO W/THROMBIN 8M KIT (HEMOSTASIS) ×4 IMPLANT
SUT BONE WAX W31G (SUTURE) ×2 IMPLANT
SUT MON AB 3-0 SH 27 (SUTURE) ×1
SUT MON AB 3-0 SH27 (SUTURE) ×1 IMPLANT
SUT SILK 2 0 (SUTURE)
SUT SILK 2-0 18XBRD TIE 12 (SUTURE) IMPLANT
SUT VIC AB 2-0 CT1 18 (SUTURE) ×2 IMPLANT
SYR BULB IRRIGATION 50ML (SYRINGE) ×2 IMPLANT
SYR CONTROL 10ML LL (SYRINGE) ×4 IMPLANT
SYR TB 1ML LUER SLIP (SYRINGE) ×2 IMPLANT
TAPE CLOTH 4X10 WHT NS (GAUZE/BANDAGES/DRESSINGS) ×2 IMPLANT
TAPE UMBILICAL COTTON 1/8X30 (MISCELLANEOUS) ×2 IMPLANT
TOWEL OR 17X24 6PK STRL BLUE (TOWEL DISPOSABLE) ×2 IMPLANT
TOWEL OR 17X26 10 PK STRL BLUE (TOWEL DISPOSABLE) ×2 IMPLANT
WATER STERILE IRR 1000ML POUR (IV SOLUTION) IMPLANT

## 2016-06-20 NOTE — H&P (Signed)
History of Present Illness  The patient is a 35 year old male who comes in today for a preoperative History and Physical. The patient is scheduled for a ACDF C4-6 to be performed by Dr. Debria Garretahari D. Shon BatonBrooks, MD on 06-20-16 . Please see the hospital record for complete dictated history and physical. Pt reports smoking 1-1.5 packs a day. Strongly advised cessation.  Problem List/Past Medical Problems Reconciled  Shoulder weakness (R29.898)  Radicular pain in left arm (M79.2)  Pain and swelling of elbow, right (M25.521, M25.421)  Herniated nucleus pulposus, C4-5 left (M50.221)  Elbow injury, right, initial encounter (S59.901A)  Pain, joint, shoulder, left (M25.512)  Neck pain on left side (M54.2)  Left cervical radiculopathy (M54.12)  Cervical stenosis of spine (M48.02)  DDD (degenerative disc disease), cervical (M50.30)  Injury of elbow, right, subsequent encounter (S59.901D)  Stiff elbow, right (M25.621)  Closed nondisplaced fracture of head of right radius with routine healing (S52.124D)   Allergies No Known Drug Allergies [01/12/2016]: Allergies Reconciled   Family History Cancer  Maternal Grandfather, Maternal Grandmother, Paternal Grandmother. Congestive Heart Failure  Paternal Grandfather. Depression  Mother. Hypertension  Father.  Social History  Tobacco use  Current every day smoker. 01/12/2016: smoke(d) 1 1/2 pack(s) per day Tobacco / smoke exposure  01/12/2016: yes Children  4 Current work status  working full time Exercise  Exercises daily; does other Former drinker  01/12/2016: In the past drank beer only occasionally per week Living situation  live with spouse Marital status  married No history of drug/alcohol rehab  Not under pain contract  Number of flights of stairs before winded  2-3  Medication History ALPRAZolam (1MG  Tablet, Oral) Active. (tid) Citalopram Hydrobromide (20MG  Tablet, Oral) Active. (qd) Norco (5-325MG  Tablet, 1  (one) Tablet Oral PO Q 8 H PRN PAIN, Taken starting 05/11/2016) Inactive. (PATIENT STATES HE IS OUT/RCY) Paliperidone ER (3MG  Tablet ER 24HR, Oral) Inactive. (qd)  Other Problems  Anxiety Disorder  Depression   Vitals  06/12/2016 1:18 PM Weight: 175.38 lb Height: 68in Body Surface Area: 1.93 m Body Mass Index: 26.67 kg/m  Temp.: 97.41F(Oral)  Pulse: 85 (Regular)  BP: 110/73 (Sitting, Right Arm, Standard)  General General Appearance-Not in acute distress. Orientation-Oriented X3. Build & Nutrition-Well nourished and Well developed.  Integumentary General Characteristics Surgical Scars - no surgical scar evidence of previous cervical surgery. Cervical Spine-Skin examination of the cervical spine is without deformity, skin lesions, lacerations or abrasions.  Chest and Lung Exam Auscultation Breath sounds - Clear.  Cardiovascular Auscultation Rhythm - Regular rate and rhythm.  Peripheral Vascular Upper Extremity Palpation - Radial pulse - Bilateral - 2+.  Neurologic Sensation Upper Extremity - Left - sensation is diminished in the upper extremity. Right - sensation is intact in the upper extremity. Reflexes Biceps Reflex - Bilateral - 2+. Brachioradialis Reflex - Bilateral - 2+. Triceps Reflex - Bilateral - 2+. Hoffman's Sign - Bilateral - Hoffman's sign not present.  Musculoskeletal Spine/Ribs/Pelvis   Cervical Spine : Inspection and Palpation - Tenderness - left cervical paraspinals tender to palpation and left trapezius tender to palpation. Strength and Tone: Strength - Deltoid - Bilateral - 5/5. Biceps - Bilateral - 5/5. Triceps - Bilateral - 5/5. Wrist Extension - Bilateral - 5/5. Hand Grip - Bilateral - 5/5. Heel walk - Bilateral - able to heel walk without difficulty. Toe Walk - Bilateral - able to walk on toes without difficulty. Heel-Toe Walk - Bilateral - able to heel-toe walk without difficulty. ROM - Flexion - Moderately Decreased and  painful. Extension - Moderately Decreased and painful. Left Lateral Flexion - Moderately Decreased and painful. Right Lateral Flexion - Moderately Decreased and painful. Left Rotation - Moderately Decreased and painful. Right Rotation - Moderately Decreased and painful. Pain - . Cervical Spine - Special Testing - axial compression test negative, cross chest impingement test negative. Non-Anatomic Signs - No non-anatomic signs present. Upper Extremity Range of Motion - No truesholder pain with IR/ER of the shoulders.  MRI  Left C4/5 HNP with compression of C5 root.  DDD with stenosis and bilateral neural compression at C5/6.  Significant right foraminal C6/7 disease.  Assessment & Plan  Anterior cervical fusion:Risks of surgery include, but are not limited to: Throat pain, swallowing difficulty, hoarseness or change in voice, death, stroke, paralysis, nerve root damage/injury, bleeding, blood clots, loss of bowel/bladder control, hardware failure, or mal-position, spinal fluid leak, adjacent segment disease, non-union, need for further surgery, ongoing or worse pain, infection. Post-operative bleeding or swelling that could require emergent surgery.  Goal Of Surgery: Discussed that goal of surgery is to reduce pain and improve function and quality of life. Patient is aware that despite all appropriate treatment that there pain and function could be the same, worse, or different.  Patient with significant left radicular arm pain.  Clinical exam indicates C5 and C6 nerve dysfunction.  MRI confirms pathology.  Due to failure to improve with conservative treatment will plan ion 2 level ACDF C4-6.

## 2016-06-20 NOTE — Discharge Instructions (Signed)

## 2016-06-20 NOTE — Transfer of Care (Signed)
Immediate Anesthesia Transfer of Care Note  Patient: Douglas Koch  Procedure(s) Performed: Procedure(s): ANTERIOR CERVICAL DECOMPRESSION/DISCECTOMY FUSION 2 LEVELS ACDF C4-6 (N/A)  Patient Location: PACU  Anesthesia Type:General  Level of Consciousness: awake, alert  and oriented  Airway & Oxygen Therapy: Patient Spontanous Breathing and Patient connected to nasal cannula oxygen  Post-op Assessment: Report given to RN and Post -op Vital signs reviewed and stable  Post vital signs: Reviewed and stable  Last Vitals:  Vitals:   06/20/16 1130  BP: (!) 127/92  Pulse: 72  Resp: 19  Temp: 36.6 C    Last Pain:  Vitals:   06/20/16 1136  TempSrc:   PainSc: 5       Patients Stated Pain Goal: 2 (06/20/16 1136)  Complications: No apparent anesthesia complications

## 2016-06-20 NOTE — Progress Notes (Signed)
Pt received from PACU with no noted distress. Surgical dressing site dry and intact. Aspen collar in place. Pt sitting up in bed eating ice. Pt oriented to room. Safety measures in place. Call bell within reach. Will continue to monitor.

## 2016-06-20 NOTE — Anesthesia Procedure Notes (Signed)
Procedure Name: Intubation Date/Time: 06/20/2016 1:12 PM Performed by: Jed LimerickHARDER, Nikola Marone S Pre-anesthesia Checklist: Patient identified, Emergency Drugs available, Suction available and Patient being monitored Patient Re-evaluated:Patient Re-evaluated prior to inductionOxygen Delivery Method: Circle System Utilized Preoxygenation: Pre-oxygenation with 100% oxygen Intubation Type: IV induction Ventilation: Mask ventilation without difficulty Laryngoscope Size: Mac and 3 Grade View: Grade I Tube type: Oral Tube size: 7.5 mm Number of attempts: 1 Airway Equipment and Method: Stylet Placement Confirmation: ETT inserted through vocal cords under direct vision,  positive ETCO2 and breath sounds checked- equal and bilateral Secured at: 23 cm Tube secured with: Tape Dental Injury: Teeth and Oropharynx as per pre-operative assessment

## 2016-06-20 NOTE — Brief Op Note (Signed)
06/20/2016  4:41 PM  PATIENT:  Douglas Koch  35 y.o. male  PRE-OPERATIVE DIAGNOSIS:  2 LEVEL CERVICAL HNP  POST-OPERATIVE DIAGNOSIS:  2 LEVEL CERVICAL HNP  PROCEDURE:  Procedure(s): ANTERIOR CERVICAL DECOMPRESSION/DISCECTOMY FUSION 2 LEVELS ACDF C4-6 (N/A)  SURGEON:  Surgeon(s) and Role:    * Venita Lickahari Ileanna Gemmill, MD - Primary  PHYSICIAN ASSISTANT:   ASSISTANTS: Carmen Mayo   ANESTHESIA:   general  EBL:  Total I/O In: 1000 [I.V.:1000] Out: 25 [Blood:25]  BLOOD ADMINISTERED:none  DRAINS: none   LOCAL MEDICATIONS USED:  MARCAINE     SPECIMEN:  No Specimen  DISPOSITION OF SPECIMEN:  N/A  COUNTS:  YES  TOURNIQUET:  * No tourniquets in log *  DICTATION: .Other Dictation: Dictation Number [id number]  PLAN OF CARE: Admit for overnight observation  PATIENT DISPOSITION:  PACU - hemodynamically stable.

## 2016-06-20 NOTE — Anesthesia Preprocedure Evaluation (Signed)
Anesthesia Evaluation  Patient identified by MRN, date of birth, ID band Patient awake    Reviewed: Allergy & Precautions, NPO status , Patient's Chart, lab work & pertinent test results  Airway Mallampati: II   Neck ROM: Limited    Dental  (+) Teeth Intact, Dental Advisory Given   Pulmonary asthma , Current Smoker,  Smokes cigarettes and marijuana daily   breath sounds clear to auscultation       Cardiovascular  Rhythm:Regular Rate:Normal     Neuro/Psych    GI/Hepatic GERD  ,  Endo/Other    Renal/GU      Musculoskeletal   Abdominal   Peds  Hematology   Anesthesia Other Findings   Reproductive/Obstetrics                             Anesthesia Physical Anesthesia Plan  ASA: II  Anesthesia Plan: General   Post-op Pain Management:    Induction: Intravenous  Airway Management Planned: Oral ETT  Additional Equipment:   Intra-op Plan:   Post-operative Plan: Extubation in OR  Informed Consent: I have reviewed the patients History and Physical, chart, labs and discussed the procedure including the risks, benefits and alternatives for the proposed anesthesia with the patient or authorized representative who has indicated his/her understanding and acceptance.   Dental advisory given  Plan Discussed with: CRNA  Anesthesia Plan Comments:         Anesthesia Quick Evaluation

## 2016-06-21 ENCOUNTER — Other Ambulatory Visit: Payer: Self-pay

## 2016-06-21 ENCOUNTER — Encounter (HOSPITAL_COMMUNITY): Payer: Self-pay | Admitting: Orthopedic Surgery

## 2016-06-21 DIAGNOSIS — J411 Mucopurulent chronic bronchitis: Secondary | ICD-10-CM

## 2016-06-21 DIAGNOSIS — M50121 Cervical disc disorder at C4-C5 level with radiculopathy: Secondary | ICD-10-CM | POA: Diagnosis not present

## 2016-06-21 MED ORDER — ALPRAZOLAM 0.5 MG PO TABS
1.0000 mg | ORAL_TABLET | Freq: Three times a day (TID) | ORAL | Status: DC | PRN
Start: 2016-06-21 — End: 2016-06-21
  Administered 2016-06-21: 1 mg via ORAL
  Filled 2016-06-21: qty 2

## 2016-06-21 MED ORDER — FLUTICASONE-SALMETEROL 100-50 MCG/DOSE IN AEPB: 1.0000 | INHALATION_SPRAY | Freq: Two times a day (BID) | RESPIRATORY_TRACT | 3 refills | Status: DC

## 2016-06-21 MED FILL — Thrombin For Soln 20000 Unit: CUTANEOUS | Qty: 1 | Status: AC

## 2016-06-21 NOTE — Op Note (Signed)
NAME:  Douglas Koch, [name]                     ACCOUNT NO.:  MEDICAL RECORD NO.:  [id number]  LOCATION:                                 FACILITY:  PHYSICIAN:  Emilee Market D. Shon BatonBrooks, M.D. DATE OF BIRTH:  April 27, 1981  DATE OF PROCEDURE:  06/20/2016 DATE OF DISCHARGE:                              OPERATIVE REPORT   PREOPERATIVE DIAGNOSIS:  Two-level cervical spondylitic radiculopathy due to disk herniation with left C5-C6 radiculopathy.  POSTOPERATIVE DIAGNOSIS:  Two-level cervical spondylitic radiculopathy due to disk herniation with left C5-C6 radiculopathy.  OPERATIVE PROCEDURE:  Anterior cervical diskectomy and fusion, C4-5 and C5-6 utilizing a size 7 small nanoLOCK Titan intervertebral cage at C4-5 and a 6-mm small Titan nanoLOCK cage at C5-6, both packed with ViviGen with a DePuy anterior cervical Skyline plate.  FIRST ASSISTANT:  [facility] Mayo.  COMPLICATIONS:  None.  HISTORY:  This is a very pleasant 35 year old gentleman who has had over a year's worth of significant progressive neck and neuropathic left arm pain.  Attempts at conservative management failed to provide any long- lasting results.  As a result, we elected to proceed with surgery. Preoperatively, the patient was having C5-C6 pain and weakness and so, we elected to proceed with a two-level ACDF.  All appropriate risks, benefits and alternatives were discussed with the patient and consent was obtained.  OPERATIVE NOTE:  The patient was brought to the operating room and placed supine on the operating table.  After successful induction of general anesthesia, endotracheal intubation, TEDs, SCDs were applied, the arms were tucked at the side and a gel roll was placed underneath the shoulder blades.  The anterior cervical spine was then prepped and draped in a standard fashion.  Time-out was taken confirming patient, procedure and all other pertinent important data.  Once this was completed, x-ray was used to identify the  midportion of the C5 vertebral body and this incision site was marked out and infiltrated with 0.25% Marcaine.  Midline incision was made starting at this center and proceeding to the left.  Sharp dissection was carried out down to the deep to the platysma.  I incised the platysma and then carried out a sharp dissection along the medial border of the sternocleidomastoid through the deep cervical fascia and prevertebral fascia.  I could then palpate and visualize the anterior cervical spine.  At this point, I swept the esophagus medially to the right and protected the carotid sheath laterally with a finger.  Using [employer] Engineering[employer] dissectors, I continued to dissect through the prevertebral fascia to completely expose the anterior longitudinal ligament from the midbody of C4 to the midbody of C6.  A needle was placed into the 4-5 disk space and x-rays taken confirming the appropriate level.  The disk was marked with the Bovie and then the longus colli muscles were mobilized using bipolar electrocautery from the midbody of C4 to the midbody of C6.  Self-retaining retractors were placed underneath the longus colli muscle.  The endotracheal cuff was deflated.  I expanded the retractor and reinflated the cuff.  A 15-blade scalpel was then used to perform an annulotomy at C4-5.  Then, using pituitary rongeurs, I removed  the bulk of the disk material.  I then trimmed down the osteophyte from the inferior aspect of the C4 vertebral body to better expose the disk space.  Using curettes, I removed the remaining disk so I was at the posterior annulus.  Distraction pins were placed into the body of 4 and 5.  I distracted the intervertebral space and maintained it with the distraction pins.  Using my 1-mm Kerrison, I trimmed down the posterior osteophyte from the C4 and C5 vertebral bodies and then used my nerve hook to mobilize the disk fragment.  This was removed creating a defect, which I exploited with my  1 mm Kerrison, resecting the remaining posterior osteophyte as well as the posterior longitudinal ligament.  At this point, I now could visualize the thecal sac and my nerve hook could freely pass underneath the uncovertebral joint bilaterally as well as the posterior aspect of the vertebral bodies.  At this point, with the decompression complete, I made sure because the left side was the worst side that an adequate foraminotomy by undercutting the uncovertebral joint.  At this point, I then rasped the endplates, I had bleeding subchondral bone, trialed and placed the size 7 spacer.  This had excellent position and fixation.  I repositioned the retractors at this point to the C5-6 level, repositioned the distraction pin and repeated the entire procedure at the C5-6 level.  Again, the PLL was taken down in a similar fashion with a nerve hook and 1-mm Kerrison and I undercut the uncovertebral joints. At this time, using the 6 and 7, I thought the 6 gave the better fit and was able to move posteriorly further to get a better indirect decompression.  I took the 6-mm implant and packed it with the ViviGen and malleted it to the appropriate depth.  At this point, the x-rays were satisfactory.  Hemostasis was obtained using bipolar electrocautery and FloSeal.  The anterior cervical plate was then obtained and contoured, and affixed to the bodies of C4-5 and 6 with 14-mm screws at the bodies of 4 and 6, 12-mm screws at the body of 5.  All screws had excellent purchase.  The final locking was performed according to manufactures standards.  The wound was copiously irrigated with normal saline.  Hemostasis was confirmed and the esophagus and trachea returned to the midline.  At this point, I then took final x-rays, which demonstrated improved indirect foraminal decompression as well as placement of the hardware.  I closed the platysma with interrupted 2-0 Vicryl suture superficial and then the skin  with 3-0 Monocryl.  Steri- Strips and dry dressing were applied as was an Biochemist, clinicalAspen collar, and the patient was ultimately extubated, transferred to the PACU without incident.  At the end of the case, all needle and sponge counts were correct.     Ilyanna Baillargeon D. Shon BatonBrooks, M.D.     DDB/MEDQ  D:  06/20/2016  T:  06/21/2016  Job:  [id number]

## 2016-06-21 NOTE — Evaluation (Addendum)
Occupational Therapy Evaluation/Discharge Patient Details Name: Douglas Koch MRN: [id number] DOB: 01/24/1981 Today's Date: 06/21/2016    History of Present Illness Pt is a 35 y.o. male s/p ACDF C4-6. Pt has a PMH significant for shoulder weakness, radicular pain in L arm, pain and swelling of R elbow, L shoulder pain, neck pain on L side, L cervical radiculopathy, cervical stenosis of spine, degenerative disc disease (cervical), anxiety, and depression.    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 Pt currently requires supervision for standing ADL. Educated pt and family concerning brace wear, compensatory ADL techniques, and cervical precautions with handout provided. Additionally discussed fall prevention strategies. They verbalize and demonstrate understanding of all topics. Pt will have 24 hour assistance/supervision available from his wife post-acute D/C. No further acute OT needs identified and no OT follow-up recommended. OT will sign off.    Follow Up Recommendations  No OT follow up;Supervision/Assistance - 24 hour    Equipment Recommendations  None recommended by OT       Precautions / Restrictions Precautions Precautions: Cervical Precaution Comments: Reviewed precautions, provided handout, and educated on ADL compensatory strategies. Required Braces or Orthoses: Cervical Brace Cervical Brace: Hard collar;At all times Restrictions Weight Bearing Restrictions: No      Mobility Bed Mobility               General bed mobility comments: Received OOB ambulating in room  Transfers Overall transfer level: Needs assistance Equipment used: None Transfers: Sit to/from Stand Sit to Stand: Supervision              Balance Overall balance assessment: Needs assistance Sitting-balance support: No upper extremity supported;Feet supported Sitting balance-Leahy Scale: Good     Standing balance  support: No upper extremity supported;During functional activity Standing balance-Leahy Scale: Good                              ADL Overall ADL's : Needs assistance/impaired     Grooming: Supervision/safety;Standing;Oral care;Cueing for sequencing Grooming Details (indicate cue type and reason): VC's for compensatory technique. Upper Body Bathing: Set up;Sitting   Lower Body Bathing: Supervison/ safety;Sit to/from stand;Cueing for back precautions   Upper Body Dressing : Set up;Sitting   Lower Body Dressing: Supervision/safety;Sit to/from stand;Cueing for back precautions   Toilet Transfer: Supervision/safety;BSC;RW   Toileting- Clothing Manipulation and Hygiene: Sit to/from stand;Supervision/safety   Tub/ Shower Transfer: Supervision/safety;Ambulation;Grab bars;Tub transfer   Functional mobility during ADLs: Supervision/safety General ADL Comments: Pt and wife educated on cervical precautions and compensatory ADl techniques.     Vision Vision Assessment?: No apparent visual deficits   Perception     Praxis      Pertinent Vitals/Pain Pain Assessment: 0-10 Pain Score: 6  Pain Location: R anterior neck Pain Descriptors / Indicators: Aching;Grimacing Pain Intervention(s): Limited activity within patient's tolerance;Monitored during session;Repositioned     Hand Dominance Right   Extremity/Trunk Assessment Upper Extremity Assessment Upper Extremity Assessment: Overall WFL for tasks assessed   Lower Extremity Assessment Lower Extremity Assessment: Overall WFL for tasks assessed       Communication Communication Communication: No difficulties   Cognition Arousal/Alertness: Awake/alert Behavior During Therapy: WFL for tasks assessed/performed Overall Cognitive Status: Within Functional Limits for tasks assessed                     General Comments  Exercises       Shoulder Instructions      Home Living Family/patient expects to  be discharged to:: Private residence Living Arrangements: Spouse/significant other Available Help at Discharge: Available 24 hours/day;Family Type of Home: House Home Access: Stairs to enter Enter[employer] CorporationEntrance Stairs-Number of Steps: single step up   Home Layout: Able to live on main level with bedroom/bathroom;Multi-level     Bathroom Shower/Tub: Tub/shower unit Shower/tub characteristics: Engineer, building servicesCurtain Bathroom Toilet: Standard     Home Equipment: None          Prior Functioning/Environment Level of Independence: Independent                 OT Problem List: Decreased activity tolerance;Impaired balance (sitting and/or standing);Decreased safety awareness;Decreased knowledge of precautions;Pain   OT Treatment/Interventions:      OT Goals(Current goals can be found in the care plan section) Acute Rehab OT Goals Patient Stated Goal: to go home today OT Goal Formulation: With patient/family Time For Goal Achievement: 06/28/16 Potential to Achieve Goals: Good  OT Frequency:     Barriers to D/C:            Co-evaluation              End of Session Equipment Utilized During Treatment: Cervical collar  Activity Tolerance: Patient tolerated treatment well Patient left: in chair;with call bell/phone within reach;with family/visitor present   Time: [id number] OT Time Calculation (min): 22 min Charges:  OT General Charges $OT Visit: 1 Procedure OT Evaluation $OT Eval Moderate Complexity: 1 Procedure  Doristine SectionCharity A Bakary Koch, OTR/L [phone] 06/21/2016, 10:38 AM

## 2016-06-21 NOTE — Care Management Note (Signed)
Case Management Note  Patient Details  Name: Douglas Koch MRN: [id number] Date of Birth: 05/02/1981  Subjective/Objective:     Pt s/p ACDF. He is from home with his wife.                Action/Plan: No f/u per OT. Pt discharging home with self care. No further needs per CM.   Expected Discharge Date:                  Expected Discharge Plan:  Home/Self Care  In-House Referral:     Discharge planning Services     Post Acute Care Choice:    Choice offered to:     DME Arranged:    DME Agency:     HH Arranged:    HH Agency:     Status of Service:  Completed, signed off  If discussed at [employer] Length of Stay Meetings, dates discussed:    Additional Comments:  Kermit BaloKelli F Jacci Ruberg, RN 06/21/2016, 3:08 PM

## 2016-06-21 NOTE — Progress Notes (Signed)
    Subjective: Procedure(s) (LRB): ANTERIOR CERVICAL DECOMPRESSION/DISCECTOMY FUSION 2 LEVELS ACDF C4-6 (N/A) 1 Day Post-Op  Patient reports pain as 2 on 0-10 scale.  Reports decreased arm pain reports incisional neck pain   Positive void Positive bowel movement Positive flatus Negative chest pain or shortness of breath  Objective: Vital signs in last 24 hours: Temp:  [97.9 F (36.6 C)-99.1 F (37.3 C)] 98.6 F (37 C) (12/28 1023) Pulse Rate:  [72-100] 82 (12/28 1023) Resp:  [7-20] 17 (12/28 1023) BP: (112-153)/(65-94) 148/92 (12/28 1023) SpO2:  [93 %-100 %] 95 % (12/28 1023) Weight:  [79.2 kg (174 lb 11.2 oz)] 79.2 kg (174 lb 11.2 oz) (12/27 1806)  Intake/Output from previous day: 12/27 0701 - 12/28 0700 In: 2440 [P.O.:240; I.V.:2150; IV Piggyback:50] Out: 325 [Urine:300; Blood:25]  Labs: No results for input(s): WBC, RBC, HCT, PLT in the last 72 hours. No results for input(s): NA, K, CL, CO2, BUN, CREATININE, GLUCOSE, CALCIUM in the last 72 hours. No results for input(s): LABPT, INR in the last 72 hours.  Physical Exam: Neurologically intact ABD soft Intact pulses distally Incision: dressing C/D/I Compartment soft  Assessment/Plan: Patient stable  xrays satisfactory Mobilization with physical therapy Encourage incentive spirometry Continue care  Advance diet Up with therapy  Ok for d/c to home   Venita Lickahari Jatavian Calica, MD [facility] [phone]

## 2016-06-21 NOTE — Anesthesia Postprocedure Evaluation (Signed)
Anesthesia Post Note  Patient: Douglas Koch  Procedure(s) Performed: Procedure(s) (LRB): ANTERIOR CERVICAL DECOMPRESSION/DISCECTOMY FUSION 2 LEVELS ACDF C4-6 (N/A)  Patient location during evaluation: PACU Anesthesia Type: General Level of consciousness: awake, sedated and oriented Pain management: pain level controlled Vital Signs Assessment: post-procedure vital signs reviewed and stable Respiratory status: spontaneous breathing, nonlabored ventilation, respiratory function stable and patient connected to nasal cannula oxygen Cardiovascular status: blood pressure returned to baseline and stable Postop Assessment: no signs of nausea or vomiting Anesthetic complications: no       Last Vitals:  Vitals:   06/21/16 0455 06/21/16 1023  BP: 112/65 (!) 148/92  Pulse: 82 82  Resp: 20 17  Temp: 36.8 C 37 C    Last Pain:  Vitals:   06/21/16 1023  TempSrc: Oral  PainSc:                  [name],JAMES TERRILL

## 2016-06-22 NOTE — Progress Notes (Signed)
D/c reviewed with pt and spouse. Patient is encouraged to follow d/c instructions-No bending/twisting/lifting. He is encouraged to call Neurosurgery for a follow up appointment.

## 2016-06-27 NOTE — Discharge Summary (Signed)
Physician Discharge Summary  Patient ID: Douglas Koch [place] MRN: [id number] DOB/AGE: 38/12/1980 37 y.o.  Admit date: 06/20/2016 Discharge date: 06/27/2016  Admission Diagnoses:  Cervical DDD  Discharge Diagnoses:  Active Problems:   Neck pain   Past Medical History:  Diagnosis Date  . Alcohol abuse   . Anxiety   . Arthritis   . Asthma   . Bipolar disorder (HCC)   . Depression   . Dyspnea    on exertion  . Gastritis   . GERD (gastroesophageal reflux disease)   . H. pylori infection   . Pancreatitis   . Ulcer (HCC)     Surgeries: Procedure(s): ANTERIOR CERVICAL DECOMPRESSION/DISCECTOMY FUSION 2 LEVELS ACDF C4-6 on 06/20/2016   Consultants (if any):   Discharged Condition: Improved  Hospital Course: Douglas Koch Douglas Koch is an 36 y.o. male who was admitted 06/20/2016 with a diagnosis of Cervical DDD and went to the operating room on 06/20/2016 and underwent the above named procedures.  Post op day 1 pt reports decreased arm pain.  Incisional pain controlled on oral medication. Pt voiding w/o difficulty.  Positive for BM.  Pt ambulating in hall.  Encouraged use of incentive spirometry.  Continue to encourage smoking cessation.   He was given perioperative antibiotics:  Anti-infectives    Start     Dose/Rate Route Frequency Ordered Stop   06/20/16 2100  ceFAZolin (ANCEF) IVPB 1 g/50 mL premix     1 g 100 mL/hr over 30 Minutes Intravenous Every 8 hours 06/20/16 1839 06/21/16 0552   06/20/16 1215  ceFAZolin (ANCEF) IVPB 2g/100 mL premix     2 g 200 mL/hr over 30 Minutes Intravenous 30 min pre-op 06/20/16 1119 06/20/16 1345    .  He was given sequential compression devices, early ambulation, and TED for DVT prophylaxis.  He benefited maximally from the hospital stay and there were no complications.    Recent vital signs:  Vitals:   06/21/16 0455 06/21/16 1023  BP: 112/65 (!) 148/92  Pulse: 82 82  Resp: 20 17  Temp: 98.2 F (36.8 C) 98.6 F (37 C)    Recent laboratory  studies:  Lab Results  Component Value Date   HGB 14.5 06/13/2016   HGB 14.7 12/30/2011   HGB 13.3 05/10/2011   Lab Results  Component Value Date   WBC 6.4 06/13/2016   PLT 199 06/13/2016   No results found for: INR Lab Results  Component Value Date   NA 139 06/13/2016   K 3.7 06/13/2016   CL 105 06/13/2016   CO2 23 06/13/2016   BUN 10 06/13/2016   CREATININE 0.76 06/13/2016   GLUCOSE 134 (H) 06/13/2016    Discharge Medications:   Allergies as of 06/21/2016      Reactions   No Known Allergies       Medication List    STOP taking these medications   acetaminophen 500 MG tablet Commonly known as:  TYLENOL   GOODYS EXTRA STRENGTH 520-260-32.5 MG Pack Generic drug:  Aspirin-Acetaminophen-Caffeine   promethazine 25 MG tablet Commonly known as:  PHENERGAN     TAKE these medications   ALPRAZolam 1 MG tablet Commonly known as:  XANAX TAKE 1 TABLET 3 TIMES A DAY AS NEEDED What changed:  how much to take  how to take this  when to take this  reasons to take this  additional instructions   citalopram 20 MG tablet Commonly known as:  CELEXA Take 1 tablet (20 mg total) by mouth daily.  methocarbamol 500 MG tablet Commonly known as:  ROBAXIN Take 1 tablet (500 mg total) by mouth 3 (three) times daily as needed for muscle spasms.   ondansetron 4 MG tablet Commonly known as:  ZOFRAN Take 1 tablet (4 mg total) by mouth every 8 (eight) hours as needed for nausea or vomiting.   oxyCODONE-acetaminophen 10-325 MG tablet Commonly known as:  PERCOCET Take 1 tablet by mouth every 4 (four) hours as needed for pain.   paliperidone 3 MG 24 hr tablet Commonly known as:  INVEGA Take 1 tablet (3 mg total) by mouth daily.   PROAIR HFA 108 (90 Base) MCG/ACT inhaler Generic drug:  albuterol INHALE 1-2 PUFFS INTO THE LUNGS EVERY 6 (SIX) HOURS AS NEEDED FOR WHEEZING OR SHORTNESS OF BREATH.       Diagnostic Studies: Dg Chest 2 View  Result Date:  06/01/2016 CLINICAL DATA:  Preop for cervical spinal surgery EXAM: CHEST  2 VIEW COMPARISON:  12/17/2013 FINDINGS: Cardiomediastinal silhouette is stable. No acute infiltrate or pleural effusion. No pulmonary edema. Bony thorax is unremarkable. IMPRESSION: No active cardiopulmonary disease. Electronically Signed   By: Natasha Mead M.Koch.   On: 06/01/2016 08:05   Dg Cervical Spine 2 Or 3 Views  Result Date: 06/20/2016 CLINICAL DATA:  Status post spinal fusion EXAM: CERVICAL SPINE - 2-3 VIEW COMPARISON:  06/20/2016 intraoperative imaging FINDINGS: Changes of anterior fusion C4-C6. No hardware complicating feature. Normal alignment P IMPRESSION: C4-C6 ACDF without visible complicating feature. Electronically Signed   By: Charlett Nose M.Koch.   On: 06/20/2016 18:33   Dg Cervical Spine 2-3 Views  Result Date: 06/20/2016 CLINICAL DATA:  C4-6 ACDF EXAM: CERVICAL SPINE - 2-3 VIEW; DG C-ARM GT 120 MIN COMPARISON:  None. FINDINGS: Changes of ACDF from C4-C6. Normal alignment. No hardware or bony complicating feature. IMPRESSION: C4-C6 ACDF.  No visible complicating feature. Electronically Signed   By: Charlett Nose M.Koch.   On: 06/20/2016 16:44   Dg C-arm Gt 120 Min  Result Date: 06/20/2016 CLINICAL DATA:  C4-6 ACDF EXAM: CERVICAL SPINE - 2-3 VIEW; DG C-ARM GT 120 MIN COMPARISON:  None. FINDINGS: Changes of ACDF from C4-C6. Normal alignment. No hardware or bony complicating feature. IMPRESSION: C4-C6 ACDF.  No visible complicating feature. Electronically Signed   By: Charlett Nose M.Koch.   On: 06/20/2016 16:44    Disposition: 01-Home or Self Care Post op medication provided Pt will present to clinic in 2 weeks.    Follow-up Information    [name], MD In 2 weeks.   Specialty:  Orthopedic Surgery Why:  For suture removal, For wound re-check, If symptoms worsen Contact information: [address] [place] [address] [phone]            Signed: Kirt Boys 06/27/2016, 10:54 AM

## 2016-07-16 DIAGNOSIS — R42 Dizziness and giddiness: Secondary | ICD-10-CM | POA: Diagnosis not present

## 2016-07-16 DIAGNOSIS — R1013 Epigastric pain: Secondary | ICD-10-CM | POA: Diagnosis not present

## 2016-07-16 DIAGNOSIS — R51 Headache: Secondary | ICD-10-CM | POA: Diagnosis not present

## 2016-07-16 DIAGNOSIS — R05 Cough: Secondary | ICD-10-CM | POA: Diagnosis not present

## 2016-07-16 DIAGNOSIS — Z720 Tobacco use: Secondary | ICD-10-CM | POA: Diagnosis not present

## 2016-07-16 DIAGNOSIS — R1110 Vomiting, unspecified: Secondary | ICD-10-CM | POA: Diagnosis not present

## 2016-07-16 DIAGNOSIS — K2970 Gastritis, unspecified, without bleeding: Secondary | ICD-10-CM | POA: Diagnosis not present

## 2016-07-16 DIAGNOSIS — R509 Fever, unspecified: Secondary | ICD-10-CM | POA: Diagnosis not present

## 2016-07-21 ENCOUNTER — Other Ambulatory Visit: Payer: Self-pay | Admitting: Family Medicine

## 2016-08-14 DIAGNOSIS — F314 Bipolar disorder, current episode depressed, severe, without psychotic features: Secondary | ICD-10-CM | POA: Diagnosis not present

## 2016-08-15 DIAGNOSIS — F314 Bipolar disorder, current episode depressed, severe, without psychotic features: Secondary | ICD-10-CM | POA: Diagnosis not present

## 2016-08-16 DIAGNOSIS — F314 Bipolar disorder, current episode depressed, severe, without psychotic features: Secondary | ICD-10-CM | POA: Diagnosis not present

## 2016-08-20 ENCOUNTER — Other Ambulatory Visit: Payer: Self-pay | Admitting: Nurse Practitioner

## 2016-08-21 NOTE — Telephone Encounter (Signed)
Patient NTBS for rx

## 2016-08-21 NOTE — Telephone Encounter (Signed)
Last seen MMM 05/2016. If approved route to nurse for phone in

## 2016-08-21 NOTE — Telephone Encounter (Signed)
Attempted to contact patient no answer and no vm    

## 2016-08-21 NOTE — Telephone Encounter (Signed)
Appt given

## 2016-08-23 ENCOUNTER — Encounter: Payer: Self-pay | Admitting: Nurse Practitioner

## 2016-08-23 ENCOUNTER — Ambulatory Visit (INDEPENDENT_AMBULATORY_CARE_PROVIDER_SITE_OTHER): Payer: BLUE CROSS/BLUE SHIELD | Admitting: Nurse Practitioner

## 2016-08-23 ENCOUNTER — Ambulatory Visit: Payer: BLUE CROSS/BLUE SHIELD | Admitting: Family Medicine

## 2016-08-23 VITALS — BP 118/76 | HR 90 | Temp 98.6°F | Ht 68.0 in | Wt 172.0 lb

## 2016-08-23 DIAGNOSIS — F411 Generalized anxiety disorder: Secondary | ICD-10-CM | POA: Diagnosis not present

## 2016-08-23 MED ORDER — ALPRAZOLAM 1 MG PO TABS: 1.0000 mg | ORAL_TABLET | Freq: Three times a day (TID) | ORAL | 0 refills | Status: DC | PRN

## 2016-08-23 NOTE — Patient Instructions (Signed)
Stress and Stress Management Stress is a normal reaction to life events. It is what you feel when life demands more than you are used to or more than you can handle. Some stress can be useful. For example, the stress reaction can help you catch the last bus of the day, study for a test, or meet a deadline at work. But stress that occurs too often or for too long can cause problems. It can affect your emotional health and interfere with relationships and normal daily activities. Too much stress can weaken your immune system and increase your risk for physical illness. If you already have a medical problem, stress can make it worse. What are the causes? All sorts of life events may cause stress. An event that causes stress for one person may not be stressful for another person. Major life events commonly cause stress. These may be positive or negative. Examples include losing your job, moving into a new home, getting married, having a baby, or losing a loved one. Less obvious life events may also cause stress, especially if they occur day after day or in combination. Examples include working long hours, driving in traffic, caring for children, being in debt, or being in a difficult relationship. What are the signs or symptoms? Stress may cause emotional symptoms including, the following:  Anxiety. This is feeling worried, afraid, on edge, overwhelmed, or out of control.  Anger. This is feeling irritated or impatient.  Depression. This is feeling sad, down, helpless, or guilty.  Difficulty focusing, remembering, or making decisions. Stress may cause physical symptoms, including the following:  Aches and pains. These may affect your head, neck, back, stomach, or other areas of your body.  Tight muscles or clenched jaw.  Low energy or trouble sleeping. Stress may cause unhealthy behaviors, including the following:  Eating to feel better (overeating) or skipping meals.  Sleeping too little, too  much, or both.  Working too much or putting off tasks (procrastination).  Smoking, drinking alcohol, or using drugs to feel better. How is this diagnosed? Stress is diagnosed through an assessment by your health care provider. Your health care provider will ask questions about your symptoms and any stressful life events.Your health care provider will also ask about your medical history and may order blood tests or other tests. Certain medical conditions and medicine can cause physical symptoms similar to stress. Mental illness can cause emotional symptoms and unhealthy behaviors similar to stress. Your health care provider may refer you to a mental health professional for further evaluation. How is this treated? Stress management is the recommended treatment for stress.The goals of stress management are reducing stressful life events and coping with stress in healthy ways. Techniques for reducing stressful life events include the following:  Stress identification. Self-monitor for stress and identify what causes stress for you. These skills may help you to avoid some stressful events.  Time management. Set your priorities, keep a calendar of events, and learn to say "no." These tools can help you avoid making too many commitments. Techniques for coping with stress include the following:  Rethinking the problem. Try to think realistically about stressful events rather than ignoring them or overreacting. Try to find the positives in a stressful situation rather than focusing on the negatives.  Exercise. Physical exercise can release both physical and emotional tension. The key is to find a form of exercise you enjoy and do it regularly.  Relaxation techniques. These relax the body and mind. Examples include yoga,  meditation, tai chi, biofeedback, deep breathing, progressive muscle relaxation, listening to music, being out in nature, journaling, and other hobbies. Again, the key is to find one or  more that you enjoy and can do regularly.  Healthy lifestyle. Eat a balanced diet, get plenty of sleep, and do not smoke. Avoid using alcohol or drugs to relax.  Strong support network. Spend time with family, friends, or other people you enjoy being around.Express your feelings and talk things over with someone you trust. Counseling or talktherapy with a mental health professional may be helpful if you are having difficulty managing stress on your own. Medicine is typically not recommended for the treatment of stress.Talk to your health care provider if you think you need medicine for symptoms of stress. Follow these instructions at home:  Keep all follow-up visits as directed by your health care provider.  Take all medicines as directed by your health care provider. Contact a health care provider if:  Your symptoms get worse or you start having new symptoms.  You feel overwhelmed by your problems and can no longer manage them on your own. Get help right away if:  You feel like hurting yourself or someone else. This information is not intended to replace advice given to you by your health care provider. Make sure you discuss any questions you have with your health care provider. Document Released: 12/05/2000 Document Revised: 11/17/2015 Document Reviewed: 02/03/2013 Elsevier Interactive Patient Education  2017 [employer].

## 2016-08-23 NOTE — Progress Notes (Signed)
   Subjective:    Patient ID: Douglas Koch, male    DOB: 10/29/1980, 36 y.o.   MRN: [id number]  HPI Patient comes in today for refill on his xanax. He is currently on xanax 1mg  TID. He has been on this for several years. He has discussed with his wife trying to wean down on this. GAD 7 : Generalized Anxiety Score 08/23/2016  Nervous, Anxious, on Edge 1  Control/stop worrying 2  Worry too much - different things 2  Trouble relaxing 0  Restless 0  Easily annoyed or irritable 1  Afraid - awful might happen 0  Total GAD 7 Score 6        Review of Systems  Constitutional: Negative.   HENT: Negative.   Respiratory: Negative.   Cardiovascular: Negative.   Genitourinary: Negative.   Neurological: Negative.   Psychiatric/Behavioral: Negative.        Objective:   Physical Exam  Constitutional: He appears well-developed and well-nourished.  Cardiovascular: Normal rate and regular rhythm.   Pulmonary/Chest: Effort normal. He has wheezes (scattered throughout).  Neurological: He is alert.  Skin: Skin is warm.  Psychiatric: He has a normal mood and affect. His behavior is normal. Judgment and thought content normal.   BP 118/76   Pulse 90   Temp 98.6 F (37 C) (Oral)   Ht 5\' 8"  (1.727 m)   Wt 172 lb (78 kg)   BMI 26.15 kg/m      Assessment & Plan:  1. GAD (generalized anxiety disorder) Discussed with patient about weaning dwon to 2 tablets per day- he will work on this over the next month Meds ordered this encounter  Medications  . ALPRAZolam (XANAX) 1 MG tablet    Sig: Take 1 tablet (1 mg total) by mouth 3 (three) times daily as needed for anxiety.    Dispense:  90 tablet    Refill:  0    DO NOT FILL TILL 06/20/16    Order Specific Question:   Supervising Provider    Answer:   Johna SheriffVINCENT, CAROL L [[id number]]     Mary-Margaret Daphine DeutscherMartin, FNP

## 2016-09-11 ENCOUNTER — Other Ambulatory Visit: Payer: Self-pay | Admitting: Nurse Practitioner

## 2016-09-20 ENCOUNTER — Other Ambulatory Visit: Payer: Self-pay | Admitting: Nurse Practitioner

## 2016-09-20 ENCOUNTER — Other Ambulatory Visit: Payer: Self-pay | Admitting: Family

## 2016-09-20 NOTE — Telephone Encounter (Signed)
Last seen and filled 08/23/16. Call in

## 2016-09-21 NOTE — Telephone Encounter (Signed)
Please call in alprazolam with 0 refills 

## 2016-09-23 ENCOUNTER — Other Ambulatory Visit: Payer: Self-pay | Admitting: Nurse Practitioner

## 2016-09-25 NOTE — Telephone Encounter (Signed)
Please call in alprazolam with 1 refills 

## 2016-09-25 NOTE — Telephone Encounter (Signed)
[facility] 

## 2016-11-23 ENCOUNTER — Other Ambulatory Visit: Payer: Self-pay | Admitting: Nurse Practitioner

## 2016-11-23 NOTE — Telephone Encounter (Signed)
Last filled 10/26/16, last seen 08/23/16. Call in

## 2016-11-23 NOTE — Telephone Encounter (Signed)
Patients wife notified

## 2016-11-23 NOTE — Telephone Encounter (Signed)
rx ready  For pick up- patient needs drug screen prior to getting rx

## 2016-12-31 ENCOUNTER — Other Ambulatory Visit: Payer: Self-pay | Admitting: Nurse Practitioner

## 2017-01-08 ENCOUNTER — Encounter: Payer: Self-pay | Admitting: Nurse Practitioner

## 2017-01-08 ENCOUNTER — Ambulatory Visit (INDEPENDENT_AMBULATORY_CARE_PROVIDER_SITE_OTHER): Payer: 59 | Admitting: Nurse Practitioner

## 2017-01-08 VITALS — BP 122/76 | HR 94 | Temp 97.7°F | Ht 68.0 in | Wt 162.0 lb

## 2017-01-08 DIAGNOSIS — J411 Mucopurulent chronic bronchitis: Secondary | ICD-10-CM | POA: Diagnosis not present

## 2017-01-08 DIAGNOSIS — R42 Dizziness and giddiness: Secondary | ICD-10-CM

## 2017-01-08 DIAGNOSIS — S0990XA Unspecified injury of head, initial encounter: Secondary | ICD-10-CM | POA: Diagnosis not present

## 2017-01-08 DIAGNOSIS — S0990XD Unspecified injury of head, subsequent encounter: Secondary | ICD-10-CM

## 2017-01-08 MED ORDER — BUDESONIDE-FORMOTEROL FUMARATE 160-4.5 MCG/ACT IN AERO: 1.0000 | INHALATION_SPRAY | Freq: Two times a day (BID) | RESPIRATORY_TRACT | 0 refills | Status: AC

## 2017-01-08 MED ORDER — MECLIZINE HCL 25 MG PO TABS
25.0000 mg | ORAL_TABLET | Freq: Three times a day (TID) | ORAL | 1 refills | Status: DC | PRN
Start: 2017-01-08 — End: 2017-07-02
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
## 2017-01-08 NOTE — Patient Instructions (Signed)
Steps to Quit Smoking Smoking tobacco can be bad for your health. It can also affect almost every organ in your body. Smoking puts you and people around you at risk for many serious long-lasting (chronic) diseases. Quitting smoking is hard, but it is one of the best things that you can do for your health. It is never too late to quit. What are the benefits of quitting smoking? When you quit smoking, you lower your risk for getting serious diseases and conditions. They can include:  Lung cancer or lung disease.  Heart disease.  Stroke.  Heart attack.  Not being able to have children (infertility).  Weak bones (osteoporosis) and broken bones (fractures).  If you have coughing, wheezing, and shortness of breath, those symptoms may get better when you quit. You may also get sick less often. If you are pregnant, quitting smoking can help to lower your chances of having a baby of low birth weight. What can I do to help me quit smoking? Talk with your doctor about what can help you quit smoking. Some things you can do (strategies) include:  Quitting smoking totally, instead of slowly cutting back how much you smoke over a period of time.  Going to in-person counseling. You are more likely to quit if you go to many counseling sessions.  Using resources and support systems, such as: ? Online chats with a counselor. ? Phone quitlines. ? Printed self-help materials. ? Support groups or group counseling. ? Text messaging programs. ? Mobile phone apps or applications.  Taking medicines. Some of these medicines may have nicotine in them. If you are pregnant or breastfeeding, do not take any medicines to quit smoking unless your doctor says it is okay. Talk with your doctor about counseling or other things that can help you.  Talk with your doctor about using more than one strategy at the same time, such as taking medicines while you are also going to in-person counseling. This can help make  quitting easier. What things can I do to make it easier to quit? Quitting smoking might feel very hard at first, but there is a lot that you can do to make it easier. Take these steps:  Talk to your family and friends. Ask them to support and encourage you.  Call phone quitlines, reach out to support groups, or work with a counselor.  Ask people who smoke to not smoke around you.  Avoid places that make you want (trigger) to smoke, such as: ? Bars. ? Parties. ? Smoke-break areas at work.  Spend time with people who do not smoke.  Lower the stress in your life. Stress can make you want to smoke. Try these things to help your stress: ? Getting regular exercise. ? Deep-breathing exercises. ? Yoga. ? Meditating. ? Doing a body scan. To do this, close your eyes, focus on one area of your body at a time from head to toe, and notice which parts of your body are tense. Try to relax the muscles in those areas.  Download or buy apps on your mobile phone or tablet that can help you stick to your quit plan. There are many free apps, such as QuitGuide from the CDC (Centers for Disease Control and Prevention). You can find more support from smokefree.gov and other websites.  This information is not intended to replace advice given to you by your health care provider. Make sure you discuss any questions you have with your health care provider. Document Released: 04/07/2009 Document   Revised: 02/07/2016 Document Reviewed: 10/26/2014 Elsevier Interactive Patient Education  2018 [employer]  

## 2017-01-22 ENCOUNTER — Telehealth: Payer: Self-pay

## 2017-01-22 ENCOUNTER — Other Ambulatory Visit: Payer: Self-pay

## 2017-01-22 DIAGNOSIS — F319 Bipolar disorder, unspecified: Secondary | ICD-10-CM

## 2017-01-22 MED ORDER — RISPERIDONE 3 MG PO TABS: 3.0000 mg | ORAL_TABLET | Freq: Every day | ORAL | 1 refills | Status: DC

## 2017-01-22 NOTE — Telephone Encounter (Signed)
Patients wife notified

## 2017-01-22 NOTE — Telephone Encounter (Signed)
Let wife know-Sent rx into pharmacy- we may need to adjust dose

## 2017-01-22 NOTE — Telephone Encounter (Signed)
Patients wife has been checking with pharmacy and insurance about an affordable option to replace patients Invega. Per patient the best option per the pharmacy would be to try risperidone. Will [facility]. They recommended a 3mg  dose to compare to the invega dose that he was taking.

## 2017-02-12 ENCOUNTER — Other Ambulatory Visit: Payer: Self-pay | Admitting: Nurse Practitioner

## 2017-02-12 MED ORDER — ALLOPURINOL 100 MG PO TABS: 100.0000 mg | ORAL_TABLET | Freq: Every day | ORAL | 2 refills | Status: DC

## 2017-03-08 ENCOUNTER — Other Ambulatory Visit: Payer: Self-pay | Admitting: Nurse Practitioner

## 2017-03-08 DIAGNOSIS — F319 Bipolar disorder, unspecified: Secondary | ICD-10-CM

## 2017-03-12 ENCOUNTER — Other Ambulatory Visit: Payer: Self-pay | Admitting: *Deleted

## 2017-03-12 MED ORDER — CITALOPRAM HYDROBROMIDE 20 MG PO TABS: 20.0000 mg | ORAL_TABLET | Freq: Every day | ORAL | 0 refills | Status: DC

## 2017-06-12 ENCOUNTER — Other Ambulatory Visit: Payer: Self-pay | Admitting: Nurse Practitioner

## 2017-06-13 NOTE — Telephone Encounter (Signed)
Last seen 01/08/17  MMM

## 2017-07-02 ENCOUNTER — Encounter: Payer: Self-pay | Admitting: Nurse Practitioner

## 2017-07-02 ENCOUNTER — Ambulatory Visit: Payer: BLUE CROSS/BLUE SHIELD | Admitting: Nurse Practitioner

## 2017-07-02 VITALS — BP 142/83 | HR 70 | Temp 97.8°F | Ht 68.0 in | Wt 163.0 lb

## 2017-07-02 DIAGNOSIS — M79641 Pain in right hand: Secondary | ICD-10-CM

## 2017-07-02 DIAGNOSIS — F3175 Bipolar disorder, in partial remission, most recent episode depressed: Secondary | ICD-10-CM

## 2017-07-02 DIAGNOSIS — M79642 Pain in left hand: Secondary | ICD-10-CM | POA: Diagnosis not present

## 2017-07-02 MED ORDER — PALIPERIDONE ER 3 MG PO TB24: 3.0000 mg | ORAL_TABLET | Freq: Every day | ORAL | 2 refills | Status: DC

## 2017-07-02 MED ORDER — MELOXICAM 15 MG PO TABS: 15.0000 mg | ORAL_TABLET | Freq: Every day | ORAL | 3 refills | Status: DC

## 2017-07-02 MED ORDER — ALPRAZOLAM 1 MG PO TABS: 1.0000 mg | ORAL_TABLET | Freq: Two times a day (BID) | ORAL | 2 refills | Status: DC | PRN

## 2017-07-02 MED ORDER — CITALOPRAM HYDROBROMIDE 20 MG PO TABS: 20.0000 mg | ORAL_TABLET | Freq: Every day | ORAL | 5 refills | Status: DC

## 2017-07-02 NOTE — Patient Instructions (Signed)
Stress and Stress Management Stress is a normal reaction to life events. It is what you feel when life demands more than you are used to or more than you can handle. Some stress can be useful. For example, the stress reaction can help you catch the last bus of the day, study for a test, or meet a deadline at work. But stress that occurs too often or for too long can cause problems. It can affect your emotional health and interfere with relationships and normal daily activities. Too much stress can weaken your immune system and increase your risk for physical illness. If you already have a medical problem, stress can make it worse. What are the causes? All sorts of life events may cause stress. An event that causes stress for one person may not be stressful for another person. Major life events commonly cause stress. These may be positive or negative. Examples include losing your job, moving into a new home, getting married, having a baby, or losing a loved one. Less obvious life events may also cause stress, especially if they occur day after day or in combination. Examples include working long hours, driving in traffic, caring for children, being in debt, or being in a difficult relationship. What are the signs or symptoms? Stress may cause emotional symptoms including, the following:  Anxiety. This is feeling worried, afraid, on edge, overwhelmed, or out of control.  Anger. This is feeling irritated or impatient.  Depression. This is feeling sad, down, helpless, or guilty.  Difficulty focusing, remembering, or making decisions.  Stress may cause physical symptoms, including the following:  Aches and pains. These may affect your head, neck, back, stomach, or other areas of your body.  Tight muscles or clenched jaw.  Low energy or trouble sleeping.  Stress may cause unhealthy behaviors, including the following:  Eating to feel better (overeating) or skipping meals.  Sleeping too little,  too much, or both.  Working too much or putting off tasks (procrastination).  Smoking, drinking alcohol, or using drugs to feel better.  How is this diagnosed? Stress is diagnosed through an assessment by your health care provider. Your health care provider will ask questions about your symptoms and any stressful life events.Your health care provider will also ask about your medical history and may order blood tests or other tests. Certain medical conditions and medicine can cause physical symptoms similar to stress. Mental illness can cause emotional symptoms and unhealthy behaviors similar to stress. Your health care provider may refer you to a mental health professional for further evaluation. How is this treated? Stress management is the recommended treatment for stress.The goals of stress management are reducing stressful life events and coping with stress in healthy ways. Techniques for reducing stressful life events include the following:  Stress identification. Self-monitor for stress and identify what causes stress for you. These skills may help you to avoid some stressful events.  Time management. Set your priorities, keep a calendar of events, and learn to say "no." These tools can help you avoid making too many commitments.  Techniques for coping with stress include the following:  Rethinking the problem. Try to think realistically about stressful events rather than ignoring them or overreacting. Try to find the positives in a stressful situation rather than focusing on the negatives.  Exercise. Physical exercise can release both physical and emotional tension. The key is to find a form of exercise you enjoy and do it regularly.  Relaxation techniques. These relax the body and  mind. Examples include yoga, meditation, tai chi, biofeedback, deep breathing, progressive muscle relaxation, listening to music, being out in nature, journaling, and other hobbies. Again, the key is to find  one or more that you enjoy and can do regularly.  Healthy lifestyle. Eat a balanced diet, get plenty of sleep, and do not smoke. Avoid using alcohol or drugs to relax.  Strong support network. Spend time with family, friends, or other people you enjoy being around.Express your feelings and talk things over with someone you trust.  Counseling or talktherapy with a mental health professional may be helpful if you are having difficulty managing stress on your own. Medicine is typically not recommended for the treatment of stress.Talk to your health care provider if you think you need medicine for symptoms of stress. Follow these instructions at home:  Keep all follow-up visits as directed by your health care provider.  Take all medicines as directed by your health care provider. Contact a health care provider if:  Your symptoms get worse or you start having new symptoms.  You feel overwhelmed by your problems and can no longer manage them on your own. Get help right away if:  You feel like hurting yourself or someone else. This information is not intended to replace advice given to you by your health care provider. Make sure you discuss any questions you have with your health care provider. Document Released: 12/05/2000 Document Revised: 11/17/2015 Document Reviewed: 02/03/2013 Elsevier Interactive Patient Education  2017 Elsevier Inc.  

## 2017-07-02 NOTE — Progress Notes (Signed)
   Subjective:    Patient ID: Douglas LuisJoey D Fritzsche, male    DOB: 08/24/1980, 37 y.o.   MRN: [id number]  HPI  Patient comes in today for follow up of bipolar disorder. He use to be on celexa and  Invega. He was also on xanax 1mg  tid but he has been without. He has bought a few off of street. His anxiety is really high. He also c/o bil hand pain. Says that the bones in his hands hurt. Cant even hardy grip things.   Review of Systems  Constitutional: Negative for activity change and appetite change.  HENT: Negative.   Eyes: Negative for pain.  Respiratory: Negative for shortness of breath.   Cardiovascular: Negative for chest pain, palpitations and leg swelling.  Gastrointestinal: Negative for abdominal pain.  Endocrine: Negative for polydipsia.  Genitourinary: Negative.   Musculoskeletal:       Finger pain in all fingers bil  Skin: Negative for rash.  Neurological: Negative for dizziness, weakness and headaches.  Hematological: Does not bruise/bleed easily.  Psychiatric/Behavioral: Negative.   All other systems reviewed and are negative.      Objective:   Physical Exam  Constitutional: He appears well-developed and well-nourished. No distress.  Cardiovascular: Normal rate and regular rhythm.  Pulmonary/Chest: Effort normal and breath sounds normal.  Musculoskeletal: Normal range of motion.  Pin in all fingers  to palpation Painful to make a fist bil No joint edema or nodules noted   BP (!) 142/83   Pulse 70   Temp 97.8 F (36.6 C) (Oral)   Ht 5\' 8"  (1.727 m)   Wt 163 lb (73.9 kg)   BMI 24.78 kg/m         Assessment & Plan:  1. Bipolar disorder, in partial remission, most recent episode depressed (HCC) Stress management Back on all meds Decreased xanax 1mg  from tid to bid - citalopram (CELEXA) 20 MG tablet; Take 1 tablet (20 mg total) by mouth daily.  Dispense: 30 tablet; Refill: 5 - ALPRAZolam (XANAX) 1 MG tablet; Take 1 tablet (1 mg total) by mouth 2 (two) times daily as  needed for anxiety.  Dispense: 60 tablet; Refill: 2 - paliperidone (INVEGA) 3 MG 24 hr tablet; Take 1 tablet (3 mg total) by mouth daily.  Dispense: 30 tablet; Refill: 2  2. Bilateral hand pain Moist heat Rest RTOprn - meloxicam (MOBIC) 15 MG tablet; Take 1 tablet (15 mg total) by mouth daily.  Dispense: 30 tablet; Refill: 3  Mary-Margaret Daphine DeutscherMartin, FNP

## 2017-10-04 ENCOUNTER — Other Ambulatory Visit: Payer: Self-pay | Admitting: Nurse Practitioner

## 2017-10-04 DIAGNOSIS — F3175 Bipolar disorder, in partial remission, most recent episode depressed: Secondary | ICD-10-CM

## 2017-10-04 NOTE — Telephone Encounter (Signed)
Last seen 07/02/17  MMM 

## 2017-10-04 NOTE — Telephone Encounter (Signed)
No needs to be seen

## 2017-10-17 ENCOUNTER — Ambulatory Visit: Payer: BLUE CROSS/BLUE SHIELD | Admitting: Nurse Practitioner

## 2017-10-18 ENCOUNTER — Ambulatory Visit: Payer: BLUE CROSS/BLUE SHIELD | Admitting: Nurse Practitioner

## 2017-10-18 ENCOUNTER — Encounter: Payer: Self-pay | Admitting: Nurse Practitioner

## 2017-10-18 VITALS — BP 109/70 | HR 66 | Temp 98.1°F | Ht 68.0 in | Wt 163.0 lb

## 2017-10-18 DIAGNOSIS — M542 Cervicalgia: Secondary | ICD-10-CM | POA: Diagnosis not present

## 2017-10-18 DIAGNOSIS — F3175 Bipolar disorder, in partial remission, most recent episode depressed: Secondary | ICD-10-CM

## 2017-10-18 MED ORDER — ALPRAZOLAM 1 MG PO TABS: 1.0000 mg | ORAL_TABLET | Freq: Two times a day (BID) | ORAL | 2 refills | Status: DC | PRN

## 2017-10-18 MED ORDER — CYCLOBENZAPRINE HCL 5 MG PO TABS: 5.0000 mg | ORAL_TABLET | Freq: Three times a day (TID) | ORAL | 1 refills | Status: DC | PRN

## 2017-10-18 NOTE — Progress Notes (Signed)
   Subjective:    Patient ID: Douglas Koch, male    DOB: 06/03/1981, 37 y.o.   MRN: [id number]  HPI  Patient comes in today for refill of xanax. He takes it BID. He gets very anxious when he does not have it to take. He is also on celexa and invega which is keepinghis mood swings under control.  Depression screen [facility] 2/9 10/18/2017 07/02/2017 08/23/2016  Decreased Interest 0 1 2  Down, Depressed, Hopeless 1 0 1  PHQ - 2 Score 1 1 3   Altered sleeping - - 1  Tired, decreased energy - - 1  Change in appetite - - 0  Feeling bad or failure about yourself  - - 0  Trouble concentrating - - 0  Moving slowly or fidgety/restless - - 0  Suicidal thoughts - - 0  PHQ-9 Score - - 5  Difficult doing work/chores - - -   GAD 7 : Generalized Anxiety Score 10/18/2017 08/23/2016  Nervous, Anxious, on Edge 3 1  Control/stop worrying 3 2  Worry too much - different things 3 2  Trouble relaxing 2 0  Restless 3 0  Easily annoyed or irritable 3 1  Afraid - awful might happen 2 0  Total GAD 7 Score 19 6  Anxiety Difficulty Very difficult -    * he is also c/o neck pain. Rates pain 6-7/10. Being still make sit worse. Staying active makes it better. Started about 2 weeks ago. Pain does not radiate down either arm. denies any numbness or tingling.    Review of Systems  Respiratory: Negative.   Cardiovascular: Negative.   Musculoskeletal: Positive for neck pain.  Psychiatric/Behavioral: The patient is nervous/anxious.   All other systems reviewed and are negative.      Objective:   Physical Exam  Constitutional: He is oriented to person, place, and time. He appears well-developed and well-nourished. He appears distressed (mild).  Cardiovascular: Normal rate, regular rhythm and normal heart sounds.  Pulmonary/Chest: Effort normal and breath sounds normal.  Musculoskeletal:  FROM of cervical spine with pain on extension and rotation to right.  Neurological: He is alert and oriented to person, place, and  time. No cranial nerve deficit.  Skin: Skin is warm and dry.  Psychiatric: He has a normal mood and affect. His behavior is normal. Judgment and thought content normal.   BP 109/70   Pulse 66   Temp 98.1 F (36.7 C) (Oral)   Ht 5\' 8"  (1.727 m)   Wt 163 lb (73.9 kg)   BMI 24.78 kg/m         Assessment & Plan:  1. Bipolar disorder, in partial remission, most recent episode depressed (HCC) Take xanax as rx Continue all other meds as rx - ALPRAZolam (XANAX) 1 MG tablet; Take 1 tablet (1 mg total) by mouth 2 (two) times daily as needed for anxiety.  Dispense: 60 tablet; Refill: 2  2. Cervical pain Moist heat to neck - Ambulatory referral to Orthopedic Surgery - cyclobenzaprine (FLEXERIL) 5 MG tablet; Take 1 tablet (5 mg total) by mouth 3 (three) times daily as needed for muscle spasms.  Dispense: 30 tablet; Refill: 1  Mary-Margaret Daphine DeutscherMartin, FNP

## 2017-10-18 NOTE — Patient Instructions (Signed)
Cervical Sprain A cervical sprain is a stretch or tear in the tissues that connect bones (ligaments) in the neck. Most neck (cervical) sprains get better in 4-6 weeks. Follow these instructions at home: If you have a neck collar:  Wear it as told by your doctor. Do not take off (do not remove) the collar unless your doctor says that this is safe.  Ask your doctor before adjusting your collar.  If you have long hair, keep it outside of the collar.  Ask your doctor if you may take off the collar for cleaning and bathing. If you may take off the collar:  Follow instructions from your doctor about how to take off the collar safely.  Clean the collar by wiping it with mild soap and water. Let it air-dry all the way.  If your collar has removable pads:  Take the pads out every 1-2 days.  Hand wash the pads with soap and water.  Let the pads air-dry all the way before you put them back in the collar. Do not dry them in a clothes dryer. Do not dry them with a hair dryer.  Check your skin under the collar for irritation or sores. If you see any, tell your doctor. Managing pain, stiffness, and swelling  Use a cervical traction device, if told by your doctor.  If told, put heat on the affected area. Do this before exercises (physical therapy) or as often as told by your doctor. Use the heat source that your doctor recommends, such as a moist heat pack or a heating pad.  Place a towel between your skin and the heat source.  Leave the heat on for 20-30 minutes.  Take the heat off (remove the heat) if your skin turns bright red. This is very important if you cannot feel pain, heat, or cold. You may have a greater risk of getting burned.  Put ice on the affected area.  Put ice in a plastic bag.  Place a towel between your skin and the bag.  Leave the ice on for 20 minutes, 2-3 times a day. Activity  Do not drive while wearing a neck collar. If you do not have a neck collar, ask your  doctor if it is safe to drive.  Do not drive or use heavy machinery while taking prescription pain medicine or muscle relaxants, unless your doctor approves.  Do not lift anything that is heavier than 10 lb (4.5 kg) until your doctor tells you that it is safe.  Rest as told by your doctor.  Avoid activities that make you feel worse. Ask your doctor what activities are safe for you.  Do exercises as told by your doctor or physical therapist. Preventing neck sprain  Practice good posture. Adjust your workstation to help with this, if needed.  Exercise regularly as told by your doctor or physical therapist.  Avoid activities that are risky or may cause a neck sprain (cervical sprain). General instructions  Take over-the-counter and prescription medicines only as told by your doctor.  Do not use any products that contain nicotine or tobacco. This includes cigarettes and e-cigarettes. If you need help quitting, ask your doctor.  Keep all follow-up visits as told by your doctor. This is important. Contact a doctor if:  You have pain or other symptoms that get worse.  You have symptoms that do not get better after 2 weeks.  You have pain that does not get better with medicine.  You start to have new,   unexplained symptoms.  You have sores or irritated skin from wearing your neck collar. Get help right away if:  You have very bad pain.  You have any of the following in any part of your body:  Loss of feeling (numbness).  Tingling.  Weakness.  You cannot move a part of your body (you have paralysis).  Your activity level does not improve. Summary  A cervical sprain is a stretch or tear in the tissues that connect bones (ligaments) in the neck.  If you have a neck (cervical) collar, do not take off the collar unless your doctor says that this is safe.  Put ice on affected areas as told by your doctor.  Put heat on affected areas as told by your doctor.  Good posture  and regular exercise can help prevent a neck sprain from happening again. This information is not intended to replace advice given to you by your health care provider. Make sure you discuss any questions you have with your health care provider. Document Released: 11/28/2007 Document Revised: 02/21/2016 Document Reviewed: 02/21/2016 Elsevier Interactive Patient Education  2017 Elsevier Inc.  

## 2017-12-10 ENCOUNTER — Other Ambulatory Visit: Payer: Self-pay | Admitting: Nurse Practitioner

## 2017-12-10 DIAGNOSIS — M79641 Pain in right hand: Secondary | ICD-10-CM

## 2017-12-10 DIAGNOSIS — F3175 Bipolar disorder, in partial remission, most recent episode depressed: Secondary | ICD-10-CM

## 2017-12-10 DIAGNOSIS — M79642 Pain in left hand: Secondary | ICD-10-CM

## 2017-12-19 DIAGNOSIS — R6883 Chills (without fever): Secondary | ICD-10-CM | POA: Diagnosis not present

## 2017-12-19 DIAGNOSIS — K2970 Gastritis, unspecified, without bleeding: Secondary | ICD-10-CM | POA: Diagnosis not present

## 2017-12-19 DIAGNOSIS — F1110 Opioid abuse, uncomplicated: Secondary | ICD-10-CM | POA: Diagnosis not present

## 2017-12-19 DIAGNOSIS — R160 Hepatomegaly, not elsewhere classified: Secondary | ICD-10-CM | POA: Diagnosis not present

## 2017-12-19 DIAGNOSIS — R1010 Upper abdominal pain, unspecified: Secondary | ICD-10-CM | POA: Diagnosis not present

## 2017-12-19 DIAGNOSIS — R197 Diarrhea, unspecified: Secondary | ICD-10-CM | POA: Diagnosis not present

## 2017-12-19 DIAGNOSIS — F17210 Nicotine dependence, cigarettes, uncomplicated: Secondary | ICD-10-CM | POA: Diagnosis not present

## 2017-12-19 DIAGNOSIS — R112 Nausea with vomiting, unspecified: Secondary | ICD-10-CM | POA: Diagnosis not present

## 2018-01-03 ENCOUNTER — Other Ambulatory Visit: Payer: Self-pay | Admitting: Nurse Practitioner

## 2018-01-03 DIAGNOSIS — F3175 Bipolar disorder, in partial remission, most recent episode depressed: Secondary | ICD-10-CM

## 2018-01-07 ENCOUNTER — Other Ambulatory Visit: Payer: Self-pay | Admitting: Nurse Practitioner

## 2018-01-07 DIAGNOSIS — M79641 Pain in right hand: Secondary | ICD-10-CM

## 2018-01-07 DIAGNOSIS — M79642 Pain in left hand: Secondary | ICD-10-CM

## 2018-01-10 ENCOUNTER — Ambulatory Visit: Payer: BLUE CROSS/BLUE SHIELD | Admitting: Family Medicine

## 2018-01-10 ENCOUNTER — Encounter: Payer: Self-pay | Admitting: Family Medicine

## 2018-01-10 VITALS — BP 129/86 | HR 95 | Temp 98.1°F | Ht 68.0 in | Wt 148.6 lb

## 2018-01-10 DIAGNOSIS — F3175 Bipolar disorder, in partial remission, most recent episode depressed: Secondary | ICD-10-CM | POA: Diagnosis not present

## 2018-01-10 DIAGNOSIS — F1911 Other psychoactive substance abuse, in remission: Secondary | ICD-10-CM

## 2018-01-10 MED ORDER — QUETIAPINE FUMARATE 100 MG PO TABS: 100.0000 mg | ORAL_TABLET | Freq: Every day | ORAL | 0 refills | Status: DC

## 2018-01-10 NOTE — Patient Instructions (Signed)
Douglas Koch!  Come back to see Douglas Koch within 7-14 days  We ware working with psychiatry to get Koch an appointment

## 2018-01-10 NOTE — Progress Notes (Signed)
   HPI  Patient presents today here for discussion about anxiety.  Patient has significant past medical history of [place] 1 [place].  He states that he was also treated with Xanax.  [facility] has [facility] call [facility] medical detox 1 day ago.  Patient states that he was abusing narcotics, marijuana, and at times using more benzodiazepines than prescribed.  Patient states that BuSpar caused "tingling all over his body".  [facility] 50 mg of Seroquel to sleep which helped somewhat.  He used 50 mg of trazodone while he was there as well without benefit.  He states that the recommended that he go up to 100 mg if he is not improving. He was given 3 tablets of 50 mg Seroquel when he was discharged.  PMH: Smoking status noted ROS: Per HPI  Objective: BP 129/86   Pulse 95   Temp 98.1 F (36.7 C) (Oral)   Ht 5\' 8"  (1.727 m)   Wt 148 lb 9.6 oz (67.4 kg)   BMI 22.59 kg/m  Gen: NAD, alert, cooperative with exam HEENT: NCAT CV: RRR, good S1/S2, no murmur Resp: CTABL, no wheezes, non-labored Ext: No edema, warm Neuro: Alert and oriented, No gross deficits  Assessment and plan:  #Bipolar disorder Patient with primarily uncontrolled anxiety symptoms currently He does report some mood swings Denies SI Refer to psychiatry, I believe he would benefit from mood stabilizers. Vesely treated with Xanax, however he understands the abuse ability of this medication.  I recommended against the use of benzodiazepines specifically naming Valium, Klonopin, Ativan, Librium, and Xanax Offered BuSpar, however he has had a bad reaction to this previously He has been successfully treated with invega for some time at 3 mg daily, he had another atypical antipsychotic, Seroquel, added while in rehab.  Considering his lower dose of paliperidone I believe it is safe to add 100 mg of Seroquel considering that that is also not a  max dose. Discussed that this is actually an unusual treatment regimen Follow-up with PCP within 2 weeks  #Substance abuse in remission Patient admits to multiple substance abuse We discussed this openly and he states that he wants to stay as clearheaded as possible.  He wants to avoid benzodiazepines   Orders Placed This Encounter  Procedures  . Ambulatory referral to Psychiatry    Referral Priority:   Routine    Referral Type:   Psychiatric    Referral Reason:   Specialty Services Required    Referred to Provider:   Neysa HotterHisada, Reina, MD    Requested Specialty:   Psychiatry    Number of Visits Requested:   1    Meds ordered this encounter  Medications  . QUEtiapine (SEROQUEL) 100 MG tablet    Sig: Take 1 tablet (100 mg total) by mouth at bedtime.    Dispense:  14 tablet    Refill:  0    Murtis SinkSam Tipton Ballow, MD [facility] 01/10/2018, 4:16 PM

## 2018-01-11 ENCOUNTER — Other Ambulatory Visit: Payer: Self-pay | Admitting: Nurse Practitioner

## 2018-01-11 DIAGNOSIS — M79641 Pain in right hand: Secondary | ICD-10-CM

## 2018-01-11 DIAGNOSIS — M79642 Pain in left hand: Secondary | ICD-10-CM

## 2018-01-13 ENCOUNTER — Ambulatory Visit: Payer: BLUE CROSS/BLUE SHIELD | Admitting: Nurse Practitioner

## 2018-01-16 DIAGNOSIS — Z8719 Personal history of other diseases of the digestive system: Secondary | ICD-10-CM | POA: Diagnosis not present

## 2018-01-16 DIAGNOSIS — F17210 Nicotine dependence, cigarettes, uncomplicated: Secondary | ICD-10-CM | POA: Diagnosis not present

## 2018-01-16 DIAGNOSIS — R112 Nausea with vomiting, unspecified: Secondary | ICD-10-CM | POA: Diagnosis not present

## 2018-01-16 DIAGNOSIS — R1013 Epigastric pain: Secondary | ICD-10-CM | POA: Diagnosis not present

## 2018-01-17 ENCOUNTER — Ambulatory Visit: Payer: BLUE CROSS/BLUE SHIELD | Admitting: Nurse Practitioner

## 2018-01-17 DIAGNOSIS — R112 Nausea with vomiting, unspecified: Secondary | ICD-10-CM | POA: Diagnosis not present

## 2018-01-28 ENCOUNTER — Encounter: Payer: Self-pay | Admitting: Nurse Practitioner

## 2018-03-10 ENCOUNTER — Other Ambulatory Visit: Payer: Self-pay | Admitting: Nurse Practitioner

## 2018-03-10 DIAGNOSIS — F3175 Bipolar disorder, in partial remission, most recent episode depressed: Secondary | ICD-10-CM

## 2018-03-25 DIAGNOSIS — F1290 Cannabis use, unspecified, uncomplicated: Secondary | ICD-10-CM | POA: Diagnosis not present

## 2018-03-25 DIAGNOSIS — F17210 Nicotine dependence, cigarettes, uncomplicated: Secondary | ICD-10-CM | POA: Diagnosis not present

## 2018-03-25 DIAGNOSIS — Z8659 Personal history of other mental and behavioral disorders: Secondary | ICD-10-CM | POA: Diagnosis not present

## 2018-03-25 DIAGNOSIS — R6883 Chills (without fever): Secondary | ICD-10-CM | POA: Diagnosis not present

## 2018-03-25 DIAGNOSIS — R112 Nausea with vomiting, unspecified: Secondary | ICD-10-CM | POA: Diagnosis not present

## 2018-03-25 DIAGNOSIS — F1099 Alcohol use, unspecified with unspecified alcohol-induced disorder: Secondary | ICD-10-CM | POA: Diagnosis not present

## 2018-03-25 DIAGNOSIS — R109 Unspecified abdominal pain: Secondary | ICD-10-CM | POA: Diagnosis not present

## 2018-03-25 DIAGNOSIS — K2950 Unspecified chronic gastritis without bleeding: Secondary | ICD-10-CM | POA: Diagnosis not present

## 2018-03-25 DIAGNOSIS — Z8719 Personal history of other diseases of the digestive system: Secondary | ICD-10-CM | POA: Diagnosis not present

## 2018-03-26 ENCOUNTER — Other Ambulatory Visit: Payer: Self-pay | Admitting: Nurse Practitioner

## 2018-03-26 DIAGNOSIS — F3175 Bipolar disorder, in partial remission, most recent episode depressed: Secondary | ICD-10-CM

## 2018-04-18 ENCOUNTER — Other Ambulatory Visit: Payer: Self-pay | Admitting: Nurse Practitioner

## 2018-04-18 DIAGNOSIS — M79642 Pain in left hand: Secondary | ICD-10-CM

## 2018-04-18 DIAGNOSIS — M79641 Pain in right hand: Secondary | ICD-10-CM

## 2018-04-22 ENCOUNTER — Other Ambulatory Visit: Payer: Self-pay | Admitting: Nurse Practitioner

## 2018-04-22 ENCOUNTER — Other Ambulatory Visit: Payer: Self-pay

## 2018-04-22 DIAGNOSIS — F3175 Bipolar disorder, in partial remission, most recent episode depressed: Secondary | ICD-10-CM

## 2018-04-22 NOTE — Telephone Encounter (Signed)
Last seen 01/10/18  MMM

## 2018-04-27 IMAGING — DX DG FINGER THUMB 2+V*L*
3 series · 3 of 3 positions shown · non-contrast
Comparison: None.

CLINICAL DATA: Recent jamming injury to the first digit, initial
encounter

EXAM:
LEFT THUMB 2+V

[finger ap]
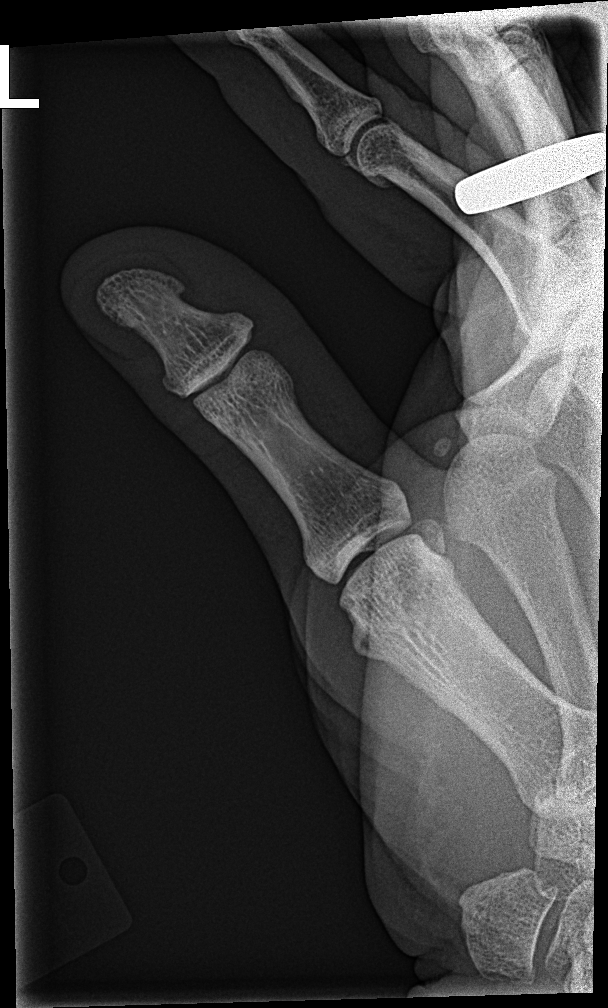

[finger obl]
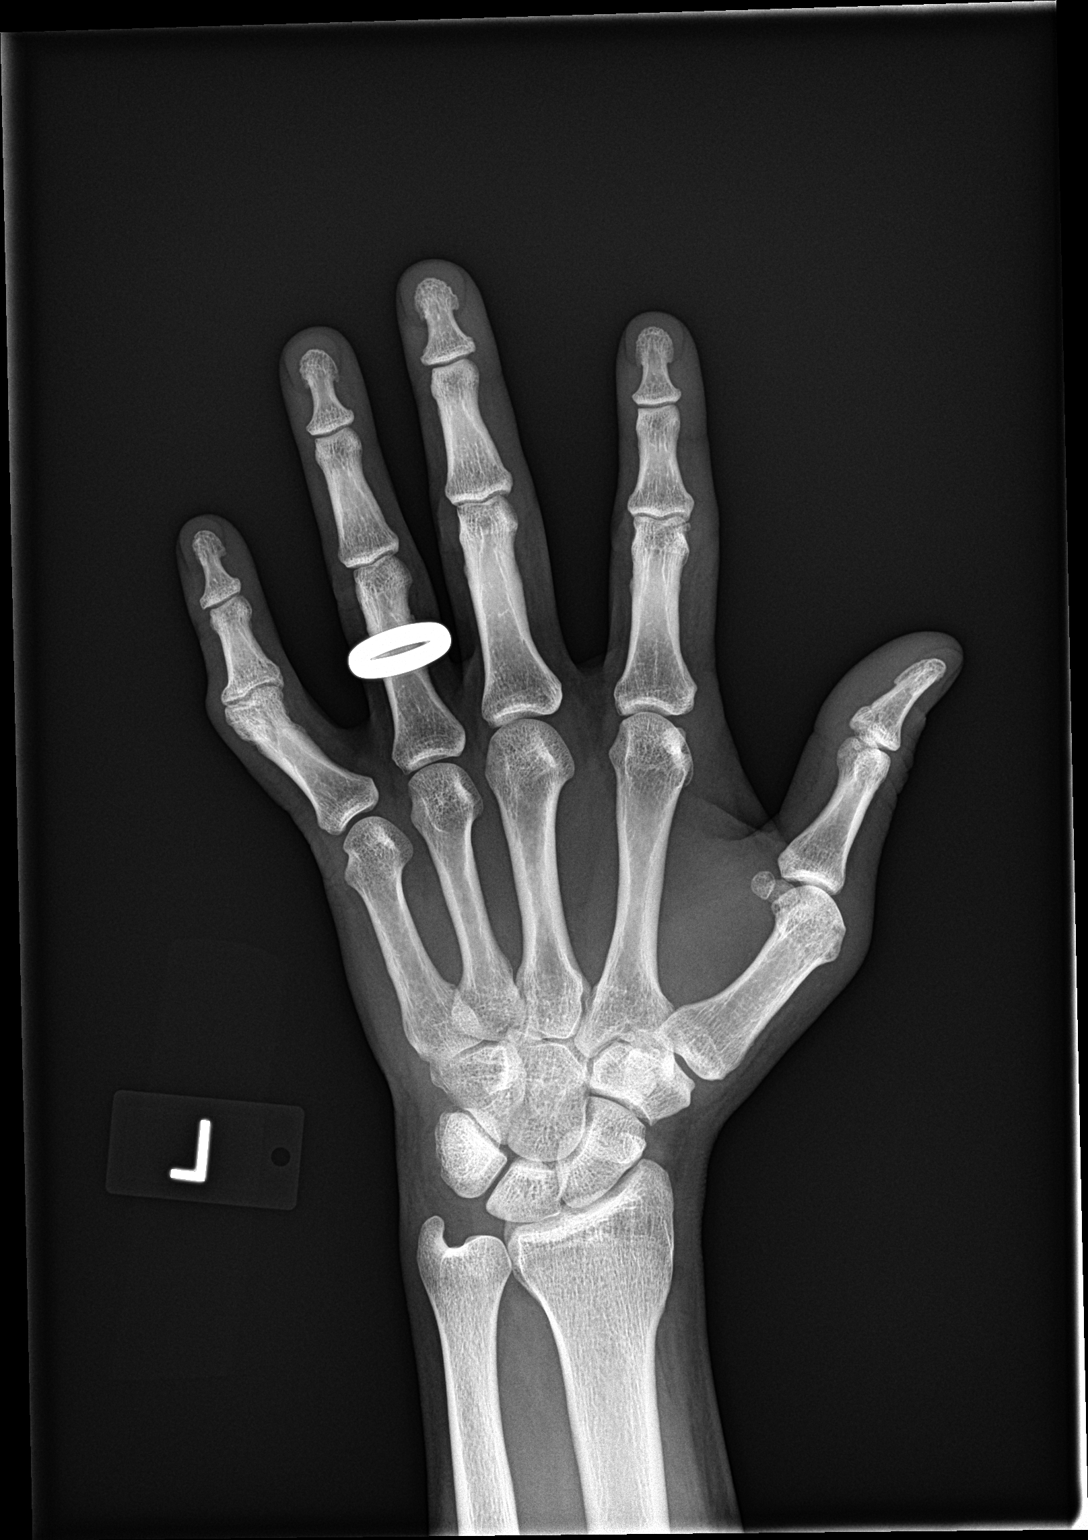

[finger lat]
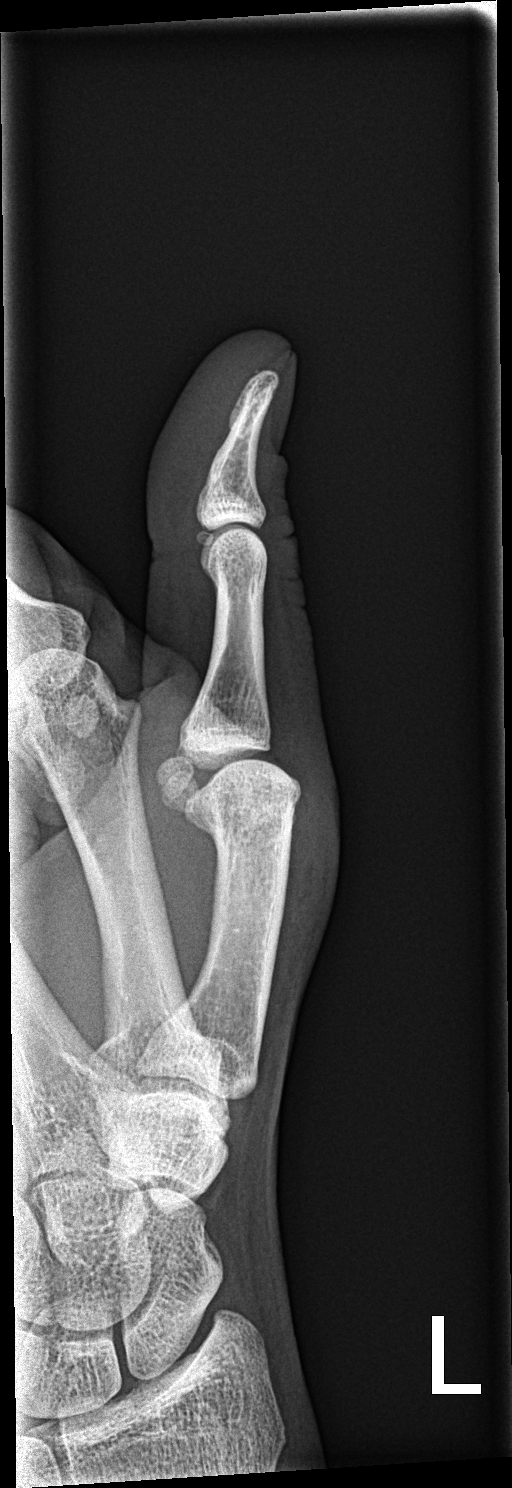

[3 of 3 positions shown; findings below may reference images not displayed]

FINDINGS: Soft tissue swelling is noted first MCP joint. No acute fracture or
dislocation is noted. Chronic changes in the fifth PIP joint.
IMPRESSION: No acute fracture is noted.  Soft tissue swelling is noted.

## 2018-05-01 ENCOUNTER — Other Ambulatory Visit: Payer: Self-pay

## 2018-05-01 DIAGNOSIS — F3175 Bipolar disorder, in partial remission, most recent episode depressed: Secondary | ICD-10-CM

## 2018-05-01 NOTE — Telephone Encounter (Signed)
Last seen 01/10/18

## 2018-05-02 MED ORDER — PALIPERIDONE ER 3 MG PO TB24: 3.0000 mg | ORAL_TABLET | Freq: Every day | ORAL | 0 refills | Status: DC

## 2018-06-01 ENCOUNTER — Other Ambulatory Visit: Payer: Self-pay | Admitting: Nurse Practitioner

## 2018-06-01 DIAGNOSIS — F3175 Bipolar disorder, in partial remission, most recent episode depressed: Secondary | ICD-10-CM

## 2018-06-02 ENCOUNTER — Other Ambulatory Visit: Payer: Self-pay | Admitting: Nurse Practitioner

## 2018-06-02 DIAGNOSIS — F3175 Bipolar disorder, in partial remission, most recent episode depressed: Secondary | ICD-10-CM

## 2018-06-02 NOTE — Telephone Encounter (Signed)
Last seen 01/10/18

## 2018-07-02 ENCOUNTER — Other Ambulatory Visit: Payer: Self-pay | Admitting: Nurse Practitioner

## 2018-07-02 DIAGNOSIS — M79641 Pain in right hand: Secondary | ICD-10-CM

## 2018-07-02 DIAGNOSIS — M79642 Pain in left hand: Secondary | ICD-10-CM

## 2018-07-06 ENCOUNTER — Other Ambulatory Visit: Payer: Self-pay | Admitting: Nurse Practitioner

## 2018-07-06 DIAGNOSIS — F3175 Bipolar disorder, in partial remission, most recent episode depressed: Secondary | ICD-10-CM

## 2018-08-07 ENCOUNTER — Encounter: Payer: Self-pay | Admitting: Nurse Practitioner

## 2018-08-07 ENCOUNTER — Ambulatory Visit (INDEPENDENT_AMBULATORY_CARE_PROVIDER_SITE_OTHER): Payer: BLUE CROSS/BLUE SHIELD | Admitting: Nurse Practitioner

## 2018-08-07 VITALS — BP 129/85 | HR 96 | Temp 97.6°F | Ht 68.0 in | Wt 152.0 lb

## 2018-08-07 DIAGNOSIS — F1310 Sedative, hypnotic or anxiolytic abuse, uncomplicated: Secondary | ICD-10-CM | POA: Diagnosis not present

## 2018-08-07 MED ORDER — HYDROXYZINE PAMOATE 50 MG PO CAPS: 50.0000 mg | ORAL_CAPSULE | Freq: Three times a day (TID) | ORAL | 0 refills | Status: DC | PRN

## 2018-08-07 NOTE — Progress Notes (Signed)
   Subjective:    Patient ID: Douglas Koch, male    DOB: 06-07-1981, 38 y.o.   MRN: [id number]   Chief Complaint: Anxiety   HPI Patient comes in with his wife today. He starts out by saying " he is in a bad way". He goes on to explain that he is very stressed out. He has not been getting xanax from our office because [facility] and left befre he was finished with treatment. He has been buying xanax off the street. He sai=ys that he was put on probation last April for leaving the scene of accident. He had to pull 10 days in jail an dhas a certain period of yime to complete them he went to jail Tuesday at 1 and stayed until today at 1pm. While he was there he started having panic attack. Became shaky, heart rate up. He took a xanax as soon as he got out today and he is much calmer. He is currently on invega and celexa and did not take the last 2 days. DR. Reche Dixon tried him on seroquel last august and he said that causes RLS.    Review of Systems  Constitutional: Negative for activity change and appetite change.  HENT: Negative.   Eyes: Negative for pain.  Respiratory: Negative for shortness of breath.   Cardiovascular: Negative for chest pain, palpitations and leg swelling.  Gastrointestinal: Negative for abdominal pain.  Endocrine: Negative for polydipsia.  Genitourinary: Negative.   Skin: Negative for rash.  Neurological: Negative for dizziness, weakness and headaches.  Hematological: Does not bruise/bleed easily.  Psychiatric/Behavioral: Negative.   All other systems reviewed and are negative.      Objective:   Physical Exam Constitutional:      Appearance: He is normal weight.  Cardiovascular:     Rate and Rhythm: Normal rate and regular rhythm.     Pulses: Normal pulses.     Heart sounds: Normal heart sounds.  Pulmonary:     Breath sounds: Normal breath sounds.  Skin:    General: Skin is warm and dry.  Neurological:     General: No  focal deficit present.     Mental Status: He is alert and oriented to person, place, and time.  Psychiatric:        Mood and Affect: Mood normal.        Behavior: Behavior normal.     Comments: Patient is very calm today Good eye contact Answers all questions appropriately.    BP 129/85   Pulse 96   Temp 97.6 F (36.4 C) (Oral)   Ht 5\' 8"  (1.727 m)   Wt 152 lb (68.9 kg)   BMI 23.11 kg/m         Assessment & Plan:  Douglas Koch in today with chief complaint of Anxiety   1. Benzodiazepine abuse (HCC) Will not give patient xanax Needs to go to in patient treatment again NA encouraged Do not mix with xanax Get back on celexa and invega - hydrOXYzine (VISTARIL) 50 MG capsule; Take 1 capsule (50 mg total) by mouth 3 (three) times daily as needed.  Dispense: 90 capsule; Refill: 0  Douglas Daphine Deutscher, FNP

## 2018-08-07 NOTE — Patient Instructions (Signed)
Finding Treatment for Addiction °Addiction is a complex disease of the brain that causes an uncontrollable (compulsive) need for: °· A substance. This includes alcohol, illegal drugs, or prescription medicines, such as painkillers. °· An activity or behavior, such as gambling or shopping. °Addiction changes the way your brain works. Because of this change: °· The need for the medicine, drug, or activity can become so strong that you think about it all the time. °· Getting more and more of your addiction becomes the most important thing to you. °· You may find yourself leaving other activities and relationships to pursue your addiction. °· You can become physically dependent on a substance. °· Your health, behavior, emotions, and relationships can change for the worse. °How do I know if I need treatment for addiction? °Addiction is a progressive disease. Without treatment, addiction can get worse. Living with addiction puts you at higher risk for injury, poor health, loss of employment, loss of money, and even death. °You might need treatment for addiction if: °· You have tried to stop or cut down, but you have not succeeded. °· You find it annoying that your friends and family are concerned about your use or behavior. °· You feel guilty about your use or behavior. °· You need a particular substance or activity to start your day or to calm down. °· You are running out of money because of your addiction. °· You have done something illegal to support your addiction. °· Your addiction has caused you: °? Health problems. °? Trouble in school, work, home, or with the police. °? To devote all your time to your addiction, and not to other responsibilities. °? To tell lies in order to hide your problem. °What types of treatment are available? °There may be options for treatment programs and plans based on your addiction, condition, needs, and preferences. No single treatment is right for everyone. °· Treatment programs can  be: °? Outpatient. You live at home and go to work or school, but you go to a clinic for treatment. °? Inpatient. You live and sleep at the program facility during treatment. °· Programs may include: °? Medicine. You may need medicine to treat the addiction itself, or to treat anxiety or depression. °? Counseling and behavior therapy. This can help individuals and families behave in healthier ways and relate more effectively. °? Support groups. Confidential group therapy, such as a 12-step program, can help individuals and families during treatment and recovery. °? A combination of education, counseling, and a 12-step, spirituality-based approach. °What should I consider when selecting a treatment program? °Think about your individual requirements when selecting a treatment program. Ask about: °· The overall approach to treatment. °? Some programs are strictly 12-step programs. Some have a more flexible approach. °? Programs may differ in length of stay, setting, and size. °? Some programs include your family in your treatment plan. Support may be offered to them throughout the treatment process, as well as instructions for them when you are discharged. °? You may continue to receive support after you have left the program. °· The types of medical services that are offered. Find out if the program: °? Offers specific treatment for your particular addiction. °? Meets all of your needs, including physical and cultural needs. °? Includes any medicines you might need. °? Offers mental health counseling as part of your treatment. °? Offers the 12-step meetings at the center, or if transport is available for patients to attend meetings at other locations. °· The   cost and types of insurance that are accepted. °? Some programs are sponsored by the government. They support patients who do not have private insurance. °? If you do not have insurance, or if you choose to attend a program that does not accept your insurance,  call the treatment center. Tell them your financial needs and whether a payment plan can be set up. °? There are also organizations that will help you find the resources for treatment. You can find them online by searching "treatment for addiction." °· If the program is certified by the appropriate government agency. °Where to find support °· Your health care provider can help you to find the right treatment. These discussions are confidential. °· The National Council on Alcoholism and Drug Dependence (NCADD). This group has information about treatment centers and programs for people who have an addiction and for family members. °? Call: 1-800-NCA-CALL (1-800-622-2255). °? Visit the website: https://www.ncadd.org/ °· The Substance Abuse and Mental Health Services Administration (SAMHSA). This organization will help you find publicly funded treatment centers, help hotlines, and counseling services near you. °? Call: 1-800-662-HELP (1-800-662-4357). °? Visit the website: www.findtreatment.samhsa.gov °· The National Problem Gambling Helpline. This is a 24-hour confidential helpline for gambling addiction. °? Call: 1-800-522-4700 °? Visit the website: https://www.ncpgambling.org/ °In countries outside of the [place] and [place], look in local directories for contact information for services in your area. °Follow these instructions at home: °· Find supportive people who will help you stay away from your addiction and stay sober. °· Do not use the substance or engage in the activity. °· If you have been through treatment: °? Follow your plan. The plan is usually developed by you and your health care provider during treatment. °? Go to meetings with other people in recovery. °? Avoid people, situations, and things that lead you to do the things you are addicted to (triggers). °Summary °· Addiction changes the way your brain works. These changes cause a desire to repeat and increase the use of the a substance or  behavior. °· Addiction is a progressive disease. Without treatment, addiction can get worse. Living with addiction puts you at higher risk for injury, poor health, loss of employment, loss of money, and even death. °· There may be options for treatment programs and plans based on your addiction, condition, needs, and preferences. No single treatment is right for everyone. °· Your health care provider can help you to find the right treatment. These discussions are confidential. °This information is not intended to replace advice given to you by your health care provider. Make sure you discuss any questions you have with your health care provider. °Document Released: 05/10/2005 Document Revised: 07/10/2017 Document Reviewed: 07/10/2017 °[employer]vier Interactive Patient Education © 2019 [employer] ° °

## 2018-08-12 DIAGNOSIS — M545 Low back pain: Secondary | ICD-10-CM | POA: Diagnosis not present

## 2018-08-19 DIAGNOSIS — K861 Other chronic pancreatitis: Secondary | ICD-10-CM | POA: Diagnosis not present

## 2018-08-19 DIAGNOSIS — F17210 Nicotine dependence, cigarettes, uncomplicated: Secondary | ICD-10-CM | POA: Diagnosis not present

## 2018-08-19 DIAGNOSIS — F1020 Alcohol dependence, uncomplicated: Secondary | ICD-10-CM | POA: Diagnosis not present

## 2018-08-19 DIAGNOSIS — K8681 Exocrine pancreatic insufficiency: Secondary | ICD-10-CM | POA: Diagnosis not present

## 2018-08-19 DIAGNOSIS — R531 Weakness: Secondary | ICD-10-CM | POA: Diagnosis not present

## 2018-08-19 DIAGNOSIS — R1013 Epigastric pain: Secondary | ICD-10-CM | POA: Diagnosis not present

## 2018-08-19 DIAGNOSIS — R112 Nausea with vomiting, unspecified: Secondary | ICD-10-CM | POA: Diagnosis not present

## 2018-08-19 DIAGNOSIS — R110 Nausea: Secondary | ICD-10-CM | POA: Diagnosis not present

## 2018-08-30 ENCOUNTER — Other Ambulatory Visit: Payer: Self-pay | Admitting: Nurse Practitioner

## 2018-08-30 DIAGNOSIS — F1310 Sedative, hypnotic or anxiolytic abuse, uncomplicated: Secondary | ICD-10-CM

## 2018-09-08 IMAGING — RF DG C-ARM GT 120 MIN
1 series · 2 of 2 positions shown · non-contrast
Comparison: None.

CLINICAL DATA: C4-6 ACDF

EXAM:
CERVICAL SPINE - 2-3 VIEW; DG C-ARM GT 120 MIN

[Series 1: run · 2 of 2 slices shown]
[im 1/2]
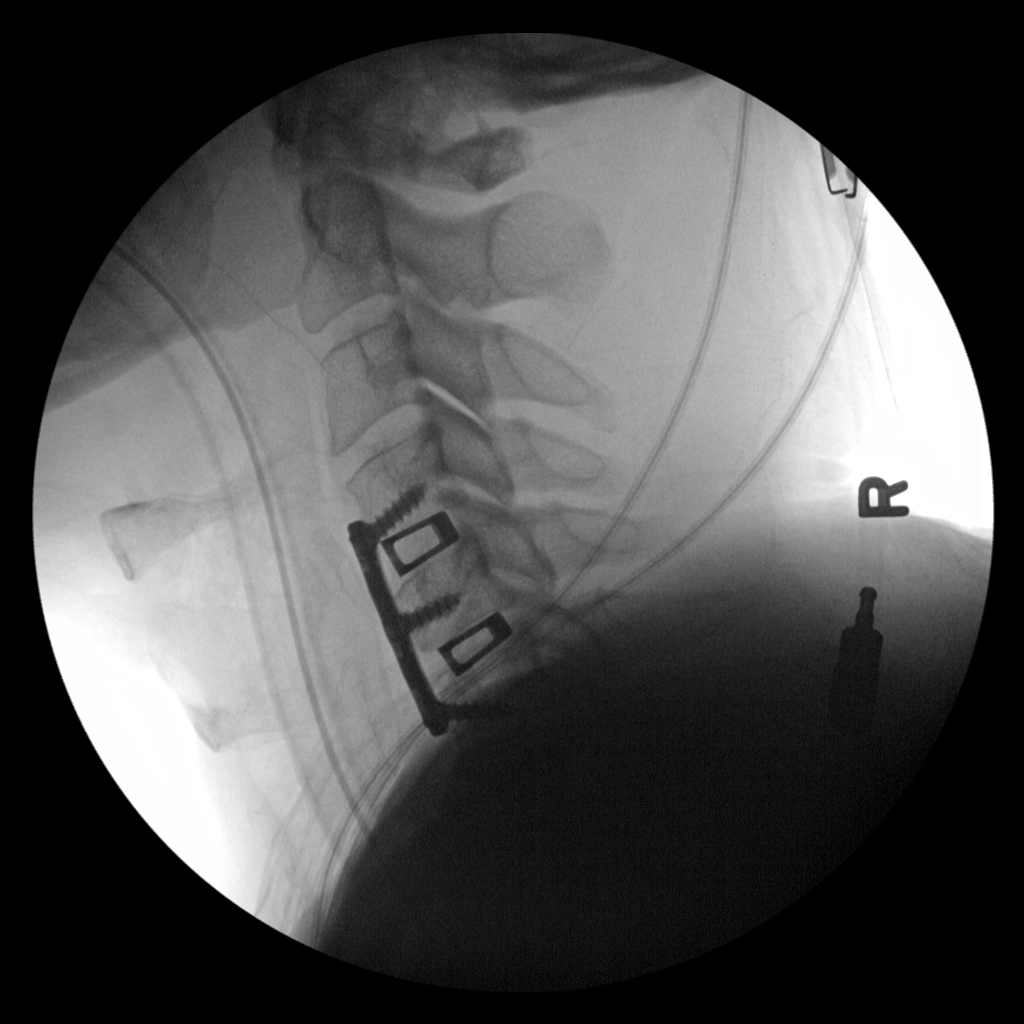
[im 2/2]
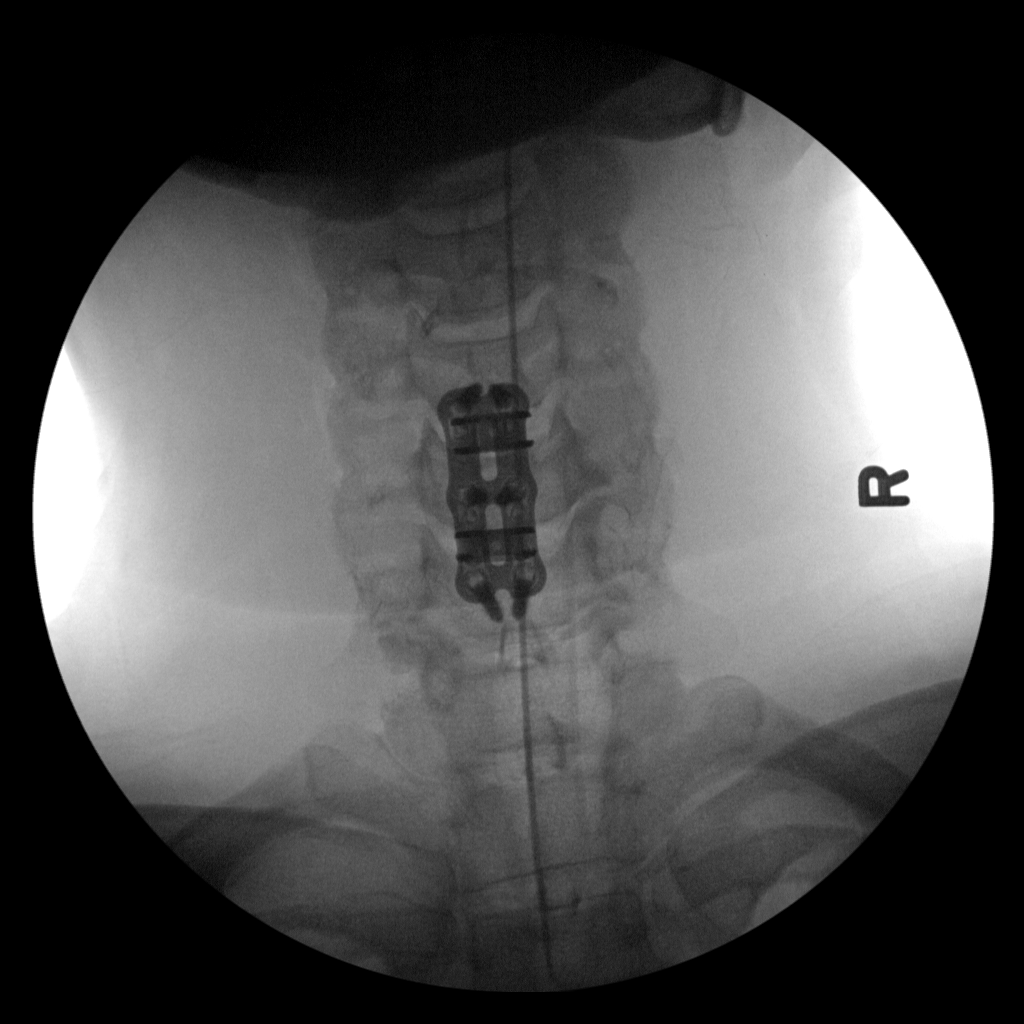

[2 of 2 positions shown; findings below may reference images not displayed]

FINDINGS: Changes of ACDF from C4-C6. Normal alignment. No hardware or bony
complicating feature.
IMPRESSION: C4-C6 ACDF.  No visible complicating feature.

## 2018-09-14 ENCOUNTER — Other Ambulatory Visit: Payer: Self-pay | Admitting: Nurse Practitioner

## 2018-09-14 DIAGNOSIS — F1310 Sedative, hypnotic or anxiolytic abuse, uncomplicated: Secondary | ICD-10-CM

## 2018-09-15 MED ORDER — HYDROXYZINE PAMOATE 50 MG PO CAPS: ORAL_CAPSULE | ORAL | 0 refills | Status: DC

## 2018-09-15 NOTE — Telephone Encounter (Signed)
Refill failed. resent °

## 2018-09-15 NOTE — Addendum Note (Signed)
Addended by: Julious Payer D on: 09/15/2018 10:17 AM   Modules accepted: Orders

## 2018-10-23 ENCOUNTER — Other Ambulatory Visit: Payer: Self-pay | Admitting: Nurse Practitioner

## 2018-10-23 ENCOUNTER — Telehealth: Payer: Self-pay | Admitting: Nurse Practitioner

## 2018-10-23 NOTE — Telephone Encounter (Signed)
meds were sent via escript, to [place]

## 2018-11-15 ENCOUNTER — Other Ambulatory Visit: Payer: Self-pay | Admitting: Nurse Practitioner

## 2018-11-28 ENCOUNTER — Other Ambulatory Visit: Payer: Self-pay | Admitting: Nurse Practitioner

## 2018-12-03 ENCOUNTER — Other Ambulatory Visit: Payer: Self-pay | Admitting: Nurse Practitioner

## 2018-12-17 ENCOUNTER — Other Ambulatory Visit: Payer: Self-pay | Admitting: Nurse Practitioner

## 2018-12-17 DIAGNOSIS — F1310 Sedative, hypnotic or anxiolytic abuse, uncomplicated: Secondary | ICD-10-CM

## 2018-12-29 DIAGNOSIS — R160 Hepatomegaly, not elsewhere classified: Secondary | ICD-10-CM | POA: Diagnosis not present

## 2018-12-29 DIAGNOSIS — R110 Nausea: Secondary | ICD-10-CM | POA: Diagnosis not present

## 2018-12-29 DIAGNOSIS — K6389 Other specified diseases of intestine: Secondary | ICD-10-CM | POA: Diagnosis not present

## 2018-12-29 DIAGNOSIS — F17210 Nicotine dependence, cigarettes, uncomplicated: Secondary | ICD-10-CM | POA: Diagnosis not present

## 2018-12-29 DIAGNOSIS — R1011 Right upper quadrant pain: Secondary | ICD-10-CM | POA: Diagnosis not present

## 2018-12-29 DIAGNOSIS — K219 Gastro-esophageal reflux disease without esophagitis: Secondary | ICD-10-CM | POA: Diagnosis not present

## 2018-12-29 DIAGNOSIS — K529 Noninfective gastroenteritis and colitis, unspecified: Secondary | ICD-10-CM | POA: Diagnosis not present

## 2018-12-29 DIAGNOSIS — K5730 Diverticulosis of large intestine without perforation or abscess without bleeding: Secondary | ICD-10-CM | POA: Diagnosis not present

## 2018-12-29 DIAGNOSIS — Z79899 Other long term (current) drug therapy: Secondary | ICD-10-CM | POA: Diagnosis not present

## 2018-12-29 DIAGNOSIS — N3289 Other specified disorders of bladder: Secondary | ICD-10-CM | POA: Diagnosis not present

## 2018-12-29 DIAGNOSIS — R1032 Left lower quadrant pain: Secondary | ICD-10-CM | POA: Diagnosis not present

## 2018-12-29 DIAGNOSIS — R109 Unspecified abdominal pain: Secondary | ICD-10-CM | POA: Diagnosis not present

## 2018-12-29 DIAGNOSIS — Z8719 Personal history of other diseases of the digestive system: Secondary | ICD-10-CM | POA: Diagnosis not present

## 2018-12-29 MED ORDER — SODIUM CHLORIDE 0.9 % IV SOLN
10.00 | INTRAVENOUS | Status: DC
Start: ? — End: 2018-12-29

## 2018-12-29 MED ORDER — GENERIC EXTERNAL MEDICATION
Status: DC
Start: ? — End: 2018-12-29

## 2019-01-09 ENCOUNTER — Other Ambulatory Visit: Payer: Self-pay | Admitting: Nurse Practitioner

## 2019-01-19 ENCOUNTER — Other Ambulatory Visit: Payer: Self-pay | Admitting: Nurse Practitioner

## 2019-01-19 DIAGNOSIS — F3175 Bipolar disorder, in partial remission, most recent episode depressed: Secondary | ICD-10-CM

## 2019-01-19 MED ORDER — PALIPERIDONE ER 3 MG PO TB24: 3.0000 mg | ORAL_TABLET | Freq: Every day | ORAL | 1 refills | Status: AC

## 2019-01-28 ENCOUNTER — Other Ambulatory Visit: Payer: Self-pay | Admitting: Nurse Practitioner

## 2019-01-28 DIAGNOSIS — M79641 Pain in right hand: Secondary | ICD-10-CM

## 2019-01-28 DIAGNOSIS — M79642 Pain in left hand: Secondary | ICD-10-CM

## 2019-02-03 ENCOUNTER — Other Ambulatory Visit: Payer: Self-pay | Admitting: Nurse Practitioner

## 2019-02-04 NOTE — Telephone Encounter (Signed)
MMM NTBS 30 days given 01/12/19 

## 2019-03-05 ENCOUNTER — Other Ambulatory Visit: Payer: Self-pay | Admitting: Nurse Practitioner

## 2019-03-06 ENCOUNTER — Other Ambulatory Visit: Payer: Self-pay

## 2019-03-06 DIAGNOSIS — M79641 Pain in right hand: Secondary | ICD-10-CM

## 2019-03-06 DIAGNOSIS — M79642 Pain in left hand: Secondary | ICD-10-CM

## 2019-03-06 MED ORDER — MELOXICAM 15 MG PO TABS: 15.0000 mg | ORAL_TABLET | Freq: Every day | ORAL | 0 refills | Status: AC

## 2019-03-11 ENCOUNTER — Encounter: Payer: Self-pay | Admitting: *Deleted

## 2019-03-11 ENCOUNTER — Other Ambulatory Visit: Payer: Self-pay | Admitting: Nurse Practitioner

## 2019-03-11 DIAGNOSIS — F1310 Sedative, hypnotic or anxiolytic abuse, uncomplicated: Secondary | ICD-10-CM

## 2019-03-11 NOTE — Telephone Encounter (Signed)
MMM. NTBS Last RF 12/18/18

## 2019-03-20 DIAGNOSIS — R319 Hematuria, unspecified: Secondary | ICD-10-CM | POA: Diagnosis not present

## 2019-03-20 DIAGNOSIS — Z79899 Other long term (current) drug therapy: Secondary | ICD-10-CM | POA: Diagnosis not present

## 2019-03-20 DIAGNOSIS — K219 Gastro-esophageal reflux disease without esophagitis: Secondary | ICD-10-CM | POA: Diagnosis not present

## 2019-03-20 DIAGNOSIS — F17210 Nicotine dependence, cigarettes, uncomplicated: Secondary | ICD-10-CM | POA: Diagnosis not present

## 2019-03-20 DIAGNOSIS — N132 Hydronephrosis with renal and ureteral calculous obstruction: Secondary | ICD-10-CM | POA: Diagnosis not present

## 2019-03-20 DIAGNOSIS — Z79891 Long term (current) use of opiate analgesic: Secondary | ICD-10-CM | POA: Diagnosis not present

## 2019-03-20 DIAGNOSIS — R1030 Lower abdominal pain, unspecified: Secondary | ICD-10-CM | POA: Diagnosis not present

## 2019-03-20 DIAGNOSIS — K5900 Constipation, unspecified: Secondary | ICD-10-CM | POA: Diagnosis not present

## 2019-03-20 DIAGNOSIS — R109 Unspecified abdominal pain: Secondary | ICD-10-CM | POA: Diagnosis not present

## 2019-03-21 ENCOUNTER — Other Ambulatory Visit: Payer: Self-pay | Admitting: Nurse Practitioner

## 2019-03-29 ENCOUNTER — Other Ambulatory Visit: Payer: Self-pay | Admitting: Nurse Practitioner

## 2019-03-29 DIAGNOSIS — F3175 Bipolar disorder, in partial remission, most recent episode depressed: Secondary | ICD-10-CM

## 2019-03-30 NOTE — Telephone Encounter (Signed)
MMM NTBS Last Rf 03/21/19

## 2019-03-31 NOTE — Telephone Encounter (Signed)
Patient aware.

## 2019-04-12 DIAGNOSIS — F1910 Other psychoactive substance abuse, uncomplicated: Secondary | ICD-10-CM | POA: Diagnosis not present

## 2019-04-12 DIAGNOSIS — Z79899 Other long term (current) drug therapy: Secondary | ICD-10-CM | POA: Diagnosis not present

## 2019-04-12 DIAGNOSIS — G8929 Other chronic pain: Secondary | ICD-10-CM | POA: Diagnosis not present

## 2019-04-12 DIAGNOSIS — E860 Dehydration: Secondary | ICD-10-CM | POA: Diagnosis not present

## 2019-04-12 DIAGNOSIS — K0889 Other specified disorders of teeth and supporting structures: Secondary | ICD-10-CM | POA: Diagnosis not present

## 2019-04-12 DIAGNOSIS — Z79891 Long term (current) use of opiate analgesic: Secondary | ICD-10-CM | POA: Diagnosis not present

## 2019-04-12 DIAGNOSIS — M542 Cervicalgia: Secondary | ICD-10-CM | POA: Diagnosis not present

## 2019-04-12 DIAGNOSIS — F17210 Nicotine dependence, cigarettes, uncomplicated: Secondary | ICD-10-CM | POA: Diagnosis not present

## 2019-04-12 DIAGNOSIS — M47816 Spondylosis without myelopathy or radiculopathy, lumbar region: Secondary | ICD-10-CM | POA: Diagnosis not present

## 2019-04-12 DIAGNOSIS — R519 Headache, unspecified: Secondary | ICD-10-CM | POA: Diagnosis not present

## 2019-04-12 DIAGNOSIS — M4316 Spondylolisthesis, lumbar region: Secondary | ICD-10-CM | POA: Diagnosis not present

## 2019-04-12 DIAGNOSIS — M545 Low back pain: Secondary | ICD-10-CM | POA: Diagnosis not present

## 2019-04-12 DIAGNOSIS — Z981 Arthrodesis status: Secondary | ICD-10-CM | POA: Diagnosis not present

## 2019-04-12 DIAGNOSIS — K219 Gastro-esophageal reflux disease without esophagitis: Secondary | ICD-10-CM | POA: Diagnosis not present

## 2019-04-12 DIAGNOSIS — E876 Hypokalemia: Secondary | ICD-10-CM | POA: Diagnosis not present

## 2019-04-12 DIAGNOSIS — I4581 Long QT syndrome: Secondary | ICD-10-CM | POA: Diagnosis not present

## 2019-04-25 ENCOUNTER — Other Ambulatory Visit: Payer: Self-pay | Admitting: Nurse Practitioner

## 2019-04-27 NOTE — Telephone Encounter (Signed)
Lmtcb/ww 11/02

## 2019-04-27 NOTE — Telephone Encounter (Signed)
MMM NTBS 30 days given 03/24/19

## 2019-05-21 DIAGNOSIS — F17210 Nicotine dependence, cigarettes, uncomplicated: Secondary | ICD-10-CM | POA: Diagnosis not present

## 2019-05-21 DIAGNOSIS — F319 Bipolar disorder, unspecified: Secondary | ICD-10-CM | POA: Diagnosis not present

## 2019-05-21 DIAGNOSIS — K2950 Unspecified chronic gastritis without bleeding: Secondary | ICD-10-CM | POA: Diagnosis not present

## 2019-05-21 DIAGNOSIS — T6591XA Toxic effect of unspecified substance, accidental (unintentional), initial encounter: Secondary | ICD-10-CM | POA: Diagnosis not present

## 2019-05-21 DIAGNOSIS — F418 Other specified anxiety disorders: Secondary | ICD-10-CM | POA: Diagnosis not present

## 2019-05-21 DIAGNOSIS — R05 Cough: Secondary | ICD-10-CM | POA: Diagnosis not present

## 2019-05-21 DIAGNOSIS — Y929 Unspecified place or not applicable: Secondary | ICD-10-CM | POA: Diagnosis not present

## 2019-05-21 DIAGNOSIS — K860 Alcohol-induced chronic pancreatitis: Secondary | ICD-10-CM | POA: Diagnosis not present

## 2019-05-21 DIAGNOSIS — R109 Unspecified abdominal pain: Secondary | ICD-10-CM | POA: Diagnosis not present

## 2019-05-21 DIAGNOSIS — R404 Transient alteration of awareness: Secondary | ICD-10-CM | POA: Diagnosis not present

## 2019-05-23 ENCOUNTER — Other Ambulatory Visit: Payer: Self-pay | Admitting: Nurse Practitioner

## 2019-05-25 NOTE — Telephone Encounter (Signed)
MMM. NTBS RF given 03/24/19

## 2019-05-25 NOTE — Telephone Encounter (Signed)
NA no [id number]

## 2019-05-27 DIAGNOSIS — Z20828 Contact with and (suspected) exposure to other viral communicable diseases: Secondary | ICD-10-CM | POA: Diagnosis not present

## 2019-05-27 DIAGNOSIS — Z1159 Encounter for screening for other viral diseases: Secondary | ICD-10-CM | POA: Diagnosis not present

## 2019-06-08 DIAGNOSIS — M79644 Pain in right finger(s): Secondary | ICD-10-CM | POA: Diagnosis not present

## 2019-06-28 MED ORDER — GENERIC EXTERNAL MEDICATION
Status: DC
Start: ? — End: 2019-06-28

## 2019-06-28 MED ORDER — ENOXAPARIN SODIUM 40 MG/0.4ML ~~LOC~~ SOLN
40.00 | SUBCUTANEOUS | Status: DC
Start: 2019-06-29 — End: 2019-06-28

## 2019-06-28 MED ORDER — NITROGLYCERIN 0.4 MG SL SUBL
0.40 | SUBLINGUAL_TABLET | SUBLINGUAL | Status: DC
Start: ? — End: 2019-06-28

## 2019-06-28 MED ORDER — POLYETHYLENE GLYCOL 3350 17 GM/SCOOP PO POWD
17.00 | ORAL | Status: DC
Start: ? — End: 2019-06-28

## 2019-06-28 MED ORDER — BENZONATATE 100 MG PO CAPS
100.00 | ORAL_CAPSULE | ORAL | Status: DC
Start: ? — End: 2019-06-28

## 2019-06-28 MED ORDER — SODIUM CHLORIDE 0.9 % IV SOLN
75.00 | INTRAVENOUS | Status: DC
Start: ? — End: 2019-06-28

## 2019-06-28 MED ORDER — ALUM & MAG HYDROXIDE-SIMETH 200-200-20 MG/5ML PO SUSP
20.00 | ORAL | Status: DC
Start: ? — End: 2019-06-28

## 2019-06-29 MED ORDER — NITROGLYCERIN 0.4 MG SL SUBL
0.40 | SUBLINGUAL_TABLET | SUBLINGUAL | Status: DC
Start: ? — End: 2019-06-29

## 2019-06-29 MED ORDER — GENERIC EXTERNAL MEDICATION
Status: DC
Start: ? — End: 2019-06-29

## 2019-06-29 MED ORDER — ALUM & MAG HYDROXIDE-SIMETH 200-200-20 MG/5ML PO SUSP
20.00 | ORAL | Status: DC
Start: ? — End: 2019-06-29

## 2020-09-27 ENCOUNTER — Encounter (INDEPENDENT_AMBULATORY_CARE_PROVIDER_SITE_OTHER): Payer: Self-pay | Admitting: *Deleted

## 2021-01-16 ENCOUNTER — Encounter (INDEPENDENT_AMBULATORY_CARE_PROVIDER_SITE_OTHER): Payer: Self-pay | Admitting: Gastroenterology

## 2021-01-16 ENCOUNTER — Encounter (INDEPENDENT_AMBULATORY_CARE_PROVIDER_SITE_OTHER): Payer: Self-pay | Admitting: *Deleted

## 2021-01-16 ENCOUNTER — Ambulatory Visit (INDEPENDENT_AMBULATORY_CARE_PROVIDER_SITE_OTHER): Payer: Self-pay | Admitting: Gastroenterology

## 2022-12-03 DIAGNOSIS — F319 Bipolar disorder, unspecified: Secondary | ICD-10-CM | POA: Diagnosis not present

## 2022-12-03 DIAGNOSIS — F419 Anxiety disorder, unspecified: Secondary | ICD-10-CM | POA: Diagnosis not present

## 2022-12-03 DIAGNOSIS — F1520 Other stimulant dependence, uncomplicated: Secondary | ICD-10-CM | POA: Diagnosis not present

## 2022-12-12 DIAGNOSIS — M542 Cervicalgia: Secondary | ICD-10-CM | POA: Diagnosis not present

## 2022-12-12 DIAGNOSIS — F1520 Other stimulant dependence, uncomplicated: Secondary | ICD-10-CM | POA: Diagnosis not present

## 2022-12-12 DIAGNOSIS — F319 Bipolar disorder, unspecified: Secondary | ICD-10-CM | POA: Diagnosis not present

## 2022-12-12 DIAGNOSIS — Z5181 Encounter for therapeutic drug level monitoring: Secondary | ICD-10-CM | POA: Diagnosis not present

## 2023-01-17 DIAGNOSIS — F1220 Cannabis dependence, uncomplicated: Secondary | ICD-10-CM | POA: Diagnosis not present

## 2023-01-17 DIAGNOSIS — F1010 Alcohol abuse, uncomplicated: Secondary | ICD-10-CM | POA: Diagnosis not present

## 2023-01-18 DIAGNOSIS — T50904A Poisoning by unspecified drugs, medicaments and biological substances, undetermined, initial encounter: Secondary | ICD-10-CM | POA: Diagnosis not present

## 2023-01-19 DIAGNOSIS — D62 Acute posthemorrhagic anemia: Secondary | ICD-10-CM | POA: Diagnosis not present

## 2023-01-19 DIAGNOSIS — R531 Weakness: Secondary | ICD-10-CM | POA: Diagnosis not present

## 2023-01-19 DIAGNOSIS — S0990XA Unspecified injury of head, initial encounter: Secondary | ICD-10-CM | POA: Diagnosis not present

## 2023-01-19 DIAGNOSIS — W1839XA Other fall on same level, initial encounter: Secondary | ICD-10-CM | POA: Diagnosis not present

## 2023-01-19 DIAGNOSIS — T401X1A Poisoning by heroin, accidental (unintentional), initial encounter: Secondary | ICD-10-CM | POA: Diagnosis not present

## 2023-01-19 DIAGNOSIS — K449 Diaphragmatic hernia without obstruction or gangrene: Secondary | ICD-10-CM | POA: Diagnosis not present

## 2023-01-19 DIAGNOSIS — F17210 Nicotine dependence, cigarettes, uncomplicated: Secondary | ICD-10-CM | POA: Diagnosis not present

## 2023-01-19 DIAGNOSIS — Z043 Encounter for examination and observation following other accident: Secondary | ICD-10-CM | POA: Diagnosis not present

## 2023-02-08 DIAGNOSIS — F418 Other specified anxiety disorders: Secondary | ICD-10-CM | POA: Diagnosis not present

## 2023-02-08 DIAGNOSIS — Z5181 Encounter for therapeutic drug level monitoring: Secondary | ICD-10-CM | POA: Diagnosis not present

## 2023-02-08 DIAGNOSIS — F3181 Bipolar II disorder: Secondary | ICD-10-CM | POA: Diagnosis not present

## 2023-02-08 DIAGNOSIS — F1520 Other stimulant dependence, uncomplicated: Secondary | ICD-10-CM | POA: Diagnosis not present

## 2023-03-26 DIAGNOSIS — L02416 Cutaneous abscess of left lower limb: Secondary | ICD-10-CM | POA: Diagnosis not present

## 2023-03-26 DIAGNOSIS — L03116 Cellulitis of left lower limb: Secondary | ICD-10-CM | POA: Diagnosis not present

## 2023-03-26 DIAGNOSIS — Z720 Tobacco use: Secondary | ICD-10-CM | POA: Diagnosis not present
